# Patient Record
Sex: Female | Born: 1960
Health system: Southern US, Community
[De-identification: ages and names within clinical notes are randomized; demographics above are authoritative.]

## PROBLEM LIST (undated history)

## (undated) DIAGNOSIS — Z9889 Other specified postprocedural states: Secondary | ICD-10-CM

## (undated) DIAGNOSIS — I10 Essential (primary) hypertension: Secondary | ICD-10-CM

## (undated) DIAGNOSIS — K5732 Diverticulitis of large intestine without perforation or abscess without bleeding: Secondary | ICD-10-CM

## (undated) DIAGNOSIS — R112 Nausea with vomiting, unspecified: Secondary | ICD-10-CM

## (undated) DIAGNOSIS — T8859XA Other complications of anesthesia, initial encounter: Secondary | ICD-10-CM

## (undated) DIAGNOSIS — T4145XA Adverse effect of unspecified anesthetic, initial encounter: Secondary | ICD-10-CM

## (undated) HISTORY — PX: CHOLECYSTECTOMY: SHX55

## (undated) HISTORY — DX: Diverticulitis of large intestine without perforation or abscess without bleeding: K57.32

## (undated) HISTORY — PX: WISDOM TOOTH EXTRACTION: SHX21

---

## 2002-09-27 ENCOUNTER — Encounter: Payer: Self-pay | Admitting: Family Medicine

## 2002-09-27 ENCOUNTER — Ambulatory Visit (HOSPITAL_COMMUNITY): Admission: RE | Admit: 2002-09-27 | Discharge: 2002-09-27 | Payer: Self-pay | Admitting: Family Medicine

## 2002-10-09 ENCOUNTER — Encounter (HOSPITAL_COMMUNITY): Admission: RE | Admit: 2002-10-09 | Discharge: 2002-11-08 | Payer: Self-pay | Admitting: Family Medicine

## 2002-10-15 ENCOUNTER — Encounter: Payer: Self-pay | Admitting: Family Medicine

## 2003-01-23 ENCOUNTER — Emergency Department (HOSPITAL_COMMUNITY): Admission: EM | Admit: 2003-01-23 | Discharge: 2003-01-23 | Payer: Self-pay | Admitting: Internal Medicine

## 2003-01-23 ENCOUNTER — Encounter: Payer: Self-pay | Admitting: Internal Medicine

## 2003-01-24 ENCOUNTER — Encounter: Payer: Self-pay | Admitting: Neurosurgery

## 2003-01-24 ENCOUNTER — Ambulatory Visit (HOSPITAL_COMMUNITY): Admission: RE | Admit: 2003-01-24 | Discharge: 2003-01-24 | Payer: Self-pay | Admitting: Neurosurgery

## 2003-04-07 ENCOUNTER — Encounter: Payer: Self-pay | Admitting: Neurosurgery

## 2003-04-07 ENCOUNTER — Ambulatory Visit (HOSPITAL_COMMUNITY): Admission: RE | Admit: 2003-04-07 | Discharge: 2003-04-07 | Payer: Self-pay | Admitting: Neurosurgery

## 2003-11-27 ENCOUNTER — Ambulatory Visit (HOSPITAL_COMMUNITY): Admission: RE | Admit: 2003-11-27 | Discharge: 2003-11-27 | Payer: Self-pay | Admitting: Neurosurgery

## 2005-11-01 ENCOUNTER — Ambulatory Visit (HOSPITAL_COMMUNITY): Admission: RE | Admit: 2005-11-01 | Discharge: 2005-11-01 | Payer: Self-pay | Admitting: Family Medicine

## 2008-01-21 ENCOUNTER — Encounter: Admission: RE | Admit: 2008-01-21 | Discharge: 2008-01-21 | Payer: Self-pay | Admitting: Unknown Physician Specialty

## 2008-01-24 ENCOUNTER — Encounter: Admission: RE | Admit: 2008-01-24 | Discharge: 2008-01-24 | Payer: Self-pay | Admitting: Unknown Physician Specialty

## 2008-08-12 ENCOUNTER — Encounter: Admission: RE | Admit: 2008-08-12 | Discharge: 2008-08-12 | Payer: Self-pay | Admitting: Family Medicine

## 2009-09-11 ENCOUNTER — Encounter: Admission: RE | Admit: 2009-09-11 | Discharge: 2009-09-11 | Payer: Self-pay | Admitting: Obstetrics and Gynecology

## 2009-10-25 ENCOUNTER — Emergency Department (HOSPITAL_COMMUNITY): Admission: EM | Admit: 2009-10-25 | Discharge: 2009-10-25 | Payer: Self-pay | Admitting: Emergency Medicine

## 2010-07-17 ENCOUNTER — Encounter: Payer: Self-pay | Admitting: Neurosurgery

## 2010-07-18 ENCOUNTER — Encounter: Payer: Self-pay | Admitting: Neurosurgery

## 2010-12-03 ENCOUNTER — Other Ambulatory Visit: Payer: Self-pay | Admitting: Unknown Physician Specialty

## 2010-12-03 DIAGNOSIS — Z1231 Encounter for screening mammogram for malignant neoplasm of breast: Secondary | ICD-10-CM

## 2010-12-16 ENCOUNTER — Ambulatory Visit
Admission: RE | Admit: 2010-12-16 | Discharge: 2010-12-16 | Disposition: A | Discharge status: Home / Self Care | Payer: 59 | Source: Ambulatory Visit | Attending: Unknown Physician Specialty | Admitting: Unknown Physician Specialty

## 2010-12-16 DIAGNOSIS — Z1231 Encounter for screening mammogram for malignant neoplasm of breast: Secondary | ICD-10-CM

## 2011-03-15 ENCOUNTER — Other Ambulatory Visit: Payer: Self-pay | Admitting: Family Medicine

## 2011-03-17 ENCOUNTER — Encounter (HOSPITAL_COMMUNITY): Payer: Self-pay

## 2011-03-17 ENCOUNTER — Ambulatory Visit (HOSPITAL_COMMUNITY)
Admission: RE | Admit: 2011-03-17 | Discharge: 2011-03-17 | Disposition: A | Discharge status: Home / Self Care | Payer: 59 | Source: Ambulatory Visit | Attending: Family Medicine | Admitting: Family Medicine

## 2011-03-17 DIAGNOSIS — R319 Hematuria, unspecified: Secondary | ICD-10-CM | POA: Insufficient documentation

## 2011-03-17 DIAGNOSIS — D3000 Benign neoplasm of unspecified kidney: Secondary | ICD-10-CM | POA: Insufficient documentation

## 2011-03-17 DIAGNOSIS — K7689 Other specified diseases of liver: Secondary | ICD-10-CM | POA: Insufficient documentation

## 2011-03-17 DIAGNOSIS — R160 Hepatomegaly, not elsewhere classified: Secondary | ICD-10-CM | POA: Insufficient documentation

## 2011-03-17 HISTORY — DX: Essential (primary) hypertension: I10

## 2011-03-17 MED ORDER — IOHEXOL 300 MG/ML  SOLN
125.0000 mL | Freq: Once | INTRAMUSCULAR | Status: AC | PRN
  Administered 2011-03-17: 125 mL via INTRAVENOUS

## 2011-03-18 ENCOUNTER — Other Ambulatory Visit: Payer: Self-pay | Admitting: Family Medicine

## 2011-03-18 DIAGNOSIS — N289 Disorder of kidney and ureter, unspecified: Secondary | ICD-10-CM

## 2011-03-24 ENCOUNTER — Ambulatory Visit (HOSPITAL_COMMUNITY)
Admission: RE | Admit: 2011-03-24 | Discharge: 2011-03-24 | Disposition: A | Discharge status: Home / Self Care | Payer: 59 | Source: Ambulatory Visit | Attending: Family Medicine | Admitting: Family Medicine

## 2011-03-24 DIAGNOSIS — R319 Hematuria, unspecified: Secondary | ICD-10-CM | POA: Insufficient documentation

## 2011-03-24 DIAGNOSIS — N289 Disorder of kidney and ureter, unspecified: Secondary | ICD-10-CM

## 2011-03-24 MED ORDER — GADOBENATE DIMEGLUMINE 529 MG/ML IV SOLN
16.0000 mL | Freq: Once | INTRAVENOUS | Status: AC | PRN
  Administered 2011-03-24: 16 mL via INTRAVENOUS

## 2011-04-15 ENCOUNTER — Ambulatory Visit (INDEPENDENT_AMBULATORY_CARE_PROVIDER_SITE_OTHER): Payer: 59 | Admitting: Urology

## 2011-04-15 DIAGNOSIS — R310 Gross hematuria: Secondary | ICD-10-CM

## 2011-07-08 ENCOUNTER — Ambulatory Visit (INDEPENDENT_AMBULATORY_CARE_PROVIDER_SITE_OTHER): Payer: BC Managed Care – PPO | Admitting: Urology

## 2011-07-08 DIAGNOSIS — Z87440 Personal history of urinary (tract) infections: Secondary | ICD-10-CM

## 2011-07-08 DIAGNOSIS — R310 Gross hematuria: Secondary | ICD-10-CM

## 2012-09-19 ENCOUNTER — Telehealth: Payer: Self-pay | Admitting: Family Medicine

## 2012-09-19 NOTE — Telephone Encounter (Signed)
May have appointment with me on Friday

## 2012-09-19 NOTE — Telephone Encounter (Signed)
Patient given appointment with Dr. Lorin Picket on Friday.

## 2012-09-19 NOTE — Telephone Encounter (Signed)
Patient is going to make an appt with Eber Jones however wants appt with Dr. Lorin Picket.  Patient wants to know if her leg could be hurting due to spot on her neck (from MRI-several years ago).  She really only wants to see Dr. Lorin Picket.  On the phone for over 30 minutes.  Did make appt for Friday 09/21/2012 at 2:10 with Eber Jones but wants Lorin Picket to call her!

## 2012-09-21 ENCOUNTER — Ambulatory Visit (INDEPENDENT_AMBULATORY_CARE_PROVIDER_SITE_OTHER): Payer: BC Managed Care – PPO | Admitting: Family Medicine

## 2012-09-21 ENCOUNTER — Encounter: Payer: Self-pay | Admitting: Family Medicine

## 2012-09-21 ENCOUNTER — Ambulatory Visit: Payer: Self-pay | Admitting: Nurse Practitioner

## 2012-09-21 VITALS — BP 124/80 | HR 80 | Ht 68.0 in | Wt 191.2 lb

## 2012-09-21 DIAGNOSIS — K3189 Other diseases of stomach and duodenum: Secondary | ICD-10-CM

## 2012-09-21 DIAGNOSIS — M79605 Pain in left leg: Secondary | ICD-10-CM

## 2012-09-21 DIAGNOSIS — M79609 Pain in unspecified limb: Secondary | ICD-10-CM

## 2012-09-21 DIAGNOSIS — R1013 Epigastric pain: Secondary | ICD-10-CM

## 2012-09-21 DIAGNOSIS — G950 Syringomyelia and syringobulbia: Secondary | ICD-10-CM

## 2012-09-21 DIAGNOSIS — I10 Essential (primary) hypertension: Secondary | ICD-10-CM

## 2012-09-21 DIAGNOSIS — M79604 Pain in right leg: Secondary | ICD-10-CM

## 2012-09-21 MED ORDER — TRAMADOL HCL 50 MG PO TABS: 50.0000 mg | ORAL_TABLET | Freq: Four times a day (QID) | ORAL | Status: AC | PRN

## 2012-09-21 NOTE — Progress Notes (Signed)
Front of leg right side Pain level 8/10 Patient relates she's had bilateral leg pain this been going on for the past week and a half it wakes her up at night sometimes on the anterior part of the left leg sometimes on the front of the right leg sometimes in her lower back in addition to this she relates neck discomfort she denies severe headaches. She denies sweats chills nausea vomiting no fevers. No wheezing or difficulty breathing. No other sickness. Tried Vicodin and did not help but made her feel bad.  She has a history of the syrinx of the next neck.  She has seen Dr. Channing Mutters for this. No other complications.  Past medical history hypertension  Vital signs noted neck mild tenderness posterior aspect of the neck lungs clear heart regular pulse normal tendon reflex diminished in the patella as well as the ankle strength is good no numbness noted  A/p   Leg pain, bilateral - Plan: traMADol (ULTRAM) 50 MG tablet  Syrinx of spinal cord  Essential hypertension, benign  Dyspepsia  We will discuss the case with Dr. Channing Mutters to see if he feels that she will need possibly MRI of the neck and a visit first the patient is going to try tramadol see if he gets better over the weekend she will let us know next week and then we will contact Dr. Channing Mutters if it is still bothering her. Followup when necessary warning signs discussed.

## 2012-09-21 NOTE — Patient Instructions (Signed)
Call next week with update

## 2012-09-24 ENCOUNTER — Telehealth: Payer: Self-pay | Admitting: Family Medicine

## 2012-09-24 NOTE — Telephone Encounter (Signed)
Tell the patient that I spoke with Dr. Channing Mutters. He does not feel that her leg symptoms are coming from her neck. He thinks is more likely that it's coming from the lumbar spine. At this point he does not recommend a MRI. He would recommend using a short round of steroids to help with this and she could still use pain medicine as necessary. So I recommend prednisone 20 mg tablets 2 per day for the next 4 days and one a day for the next 4 days. I would also recommend that she followup in approximately 8-10 days. Certainly if symptoms go away may not have to followup she could call. If worsening symptoms may need to be seen sooner.

## 2012-09-24 NOTE — Telephone Encounter (Signed)
Let me speak with Dr.Roy neuorosurgeon in [place]

## 2012-09-24 NOTE — Telephone Encounter (Signed)
Pt states she is not improving for her leg pain, to go ahead with the next step please

## 2012-09-25 ENCOUNTER — Telehealth: Payer: Self-pay | Admitting: *Deleted

## 2012-09-25 NOTE — Telephone Encounter (Signed)
Left message to return call 

## 2012-09-28 ENCOUNTER — Encounter: Payer: Self-pay | Admitting: *Deleted

## 2012-10-02 ENCOUNTER — Ambulatory Visit (INDEPENDENT_AMBULATORY_CARE_PROVIDER_SITE_OTHER): Payer: BC Managed Care – PPO | Admitting: Family Medicine

## 2012-10-02 ENCOUNTER — Encounter: Payer: Self-pay | Admitting: Family Medicine

## 2012-10-02 VITALS — BP 122/74 | HR 90 | Ht 67.0 in | Wt 195.0 lb

## 2012-10-02 DIAGNOSIS — M545 Low back pain, unspecified: Secondary | ICD-10-CM

## 2012-10-02 NOTE — Progress Notes (Deleted)
  Subjective:    Patient ID: Anita Shea, female    DOB: [birth year], 52 y.o.   MRN: [id number]  Leg Pain  The incident occurred more than 1 week ago. The incident occurred at home. The injury mechanism is unknown. The pain is present in the right leg. The quality of the pain is described as aching. The pain is at a severity of 8/10. The pain is moderate. The pain has been fluctuating since onset. Associated symptoms include numbness. She reports no foreign bodies present. The symptoms are aggravated by movement. She has tried elevation and acetaminophen for the symptoms. The treatment provided no relief.       Review of Systems  Neurological: Positive for numbness.      [phone] Objective:   Physical Exam        Assessment & Plan:

## 2012-10-02 NOTE — Patient Instructions (Addendum)
Go do xray lumbar xray I'll call with results

## 2012-10-02 NOTE — Progress Notes (Signed)
  Subjective:    Patient ID: Anita Shea, female    DOB: [birth year], 52 y.o.   MRN: [id number]  HPIthis patient with unusual symptoms. She is having pain in both legs. It started approximately 15 days ago. She describes severe pain that radiates from her hips down the front of her legs on the right leg it radiates all the way down to the foot on the left leg it radiates down to the knee she states the burning aching pain occasionally tingling it does keep her awake at night is hard to find any comfortable position she cannot take anti-inflammatories due to an allergy she relates that if she uses all TRAM it does seem to help. She states that she does not want to keep taking medicine she wants a problem go away. She knows of no injury but she did do some bicycle riding the weekend before this started occurring. Has never had this problem before.  She did try a trial of prednisone and that did not seem to help much.  Review of Systemssee above no constipation rectal bleeding fever chills or abdominal pain.she does relate a little bit of lower back pain.     Objective:   Physical Exam Abdomen is soft lower back nontender she does have some tenderness in the groin region left and right side. Negative straight leg raise reflexes good strength good       Assessment & Plan:  Leg pain-hard to tell if this is from lumbar spine or if this is a tendinitis where the muscles attach at the groin region. She may continue the Ultram we will do lumbar spine x-rays we will be setting her up with physical medicine specialist.

## 2012-10-03 ENCOUNTER — Ambulatory Visit (HOSPITAL_COMMUNITY)
Admission: RE | Admit: 2012-10-03 | Discharge: 2012-10-03 | Disposition: A | Discharge status: Home / Self Care | Payer: BC Managed Care – PPO | Source: Ambulatory Visit | Attending: Family Medicine | Admitting: Family Medicine

## 2012-10-03 DIAGNOSIS — M545 Low back pain, unspecified: Secondary | ICD-10-CM

## 2012-10-03 DIAGNOSIS — R209 Unspecified disturbances of skin sensation: Secondary | ICD-10-CM | POA: Insufficient documentation

## 2012-10-05 ENCOUNTER — Other Ambulatory Visit: Payer: Self-pay | Admitting: Family Medicine

## 2012-10-05 DIAGNOSIS — M25569 Pain in unspecified knee: Secondary | ICD-10-CM

## 2012-12-22 ENCOUNTER — Other Ambulatory Visit: Payer: Self-pay | Admitting: Family Medicine

## 2012-12-24 ENCOUNTER — Other Ambulatory Visit: Payer: Self-pay

## 2012-12-24 DIAGNOSIS — Z1231 Encounter for screening mammogram for malignant neoplasm of breast: Secondary | ICD-10-CM

## 2013-01-21 ENCOUNTER — Ambulatory Visit: Payer: BC Managed Care – PPO

## 2013-02-05 ENCOUNTER — Ambulatory Visit
Admission: RE | Admit: 2013-02-05 | Discharge: 2013-02-05 | Disposition: A | Discharge status: Home / Self Care | Payer: BC Managed Care – PPO | Source: Ambulatory Visit

## 2013-02-05 DIAGNOSIS — Z1231 Encounter for screening mammogram for malignant neoplasm of breast: Secondary | ICD-10-CM

## 2013-03-04 ENCOUNTER — Ambulatory Visit (INDEPENDENT_AMBULATORY_CARE_PROVIDER_SITE_OTHER): Payer: BC Managed Care – PPO | Admitting: Family Medicine

## 2013-03-04 ENCOUNTER — Encounter: Payer: Self-pay | Admitting: Family Medicine

## 2013-03-04 ENCOUNTER — Other Ambulatory Visit: Payer: Self-pay | Admitting: Family Medicine

## 2013-03-04 VITALS — BP 136/90 | Temp 98.7°F | Ht 67.5 in | Wt 203.8 lb

## 2013-03-04 DIAGNOSIS — J329 Chronic sinusitis, unspecified: Secondary | ICD-10-CM

## 2013-03-04 MED ORDER — CEFDINIR 300 MG PO CAPS: 300.0000 mg | ORAL_CAPSULE | Freq: Two times a day (BID) | ORAL | Status: DC

## 2013-03-04 NOTE — Progress Notes (Signed)
  Subjective:    Patient ID: Anita Shea, female    DOB: 01/09/1961, 52 y.o.   MRN: [id number]  Sinusitis This is a new problem. The current episode started in the past 7 days. The problem is unchanged. There has been no fever. Associated symptoms include congestion, headaches, a hoarse voice, neck pain, sinus pressure, sneezing, a sore throat and swollen glands. Past treatments include oral decongestants. The treatment provided no relief.   Friday, socked in cong estion and cough,   Took two tyl sinus, didn't help much, throat scratchy,   Review of Systems  HENT: Positive for congestion, sore throat, hoarse voice, sneezing, neck pain and sinus pressure.   Neurological: Positive for headaches.   No naus no loose stools     Objective:   Physical Exam No home illness Alert mild malaise. HEENT moderate nasal congestion. Frontal tenderness. Pharynx erythematous neck supple. Lungs clear. Heart regular in rhythm.      Assessment & Plan:  Impression #1 acute rhinosinusitis. Plan antibiotics prescribed. Symptomatic care discussed. WSL

## 2013-03-25 ENCOUNTER — Other Ambulatory Visit: Payer: Self-pay | Admitting: Family Medicine

## 2013-06-18 ENCOUNTER — Other Ambulatory Visit: Payer: Self-pay | Admitting: Family Medicine

## 2013-06-29 ENCOUNTER — Other Ambulatory Visit: Payer: Self-pay | Admitting: Family Medicine

## 2013-07-17 ENCOUNTER — Other Ambulatory Visit: Payer: Self-pay | Admitting: Family Medicine

## 2013-07-29 ENCOUNTER — Other Ambulatory Visit: Payer: Self-pay | Admitting: Family Medicine

## 2013-10-30 ENCOUNTER — Other Ambulatory Visit: Payer: Self-pay | Admitting: Family Medicine

## 2013-10-31 ENCOUNTER — Ambulatory Visit (INDEPENDENT_AMBULATORY_CARE_PROVIDER_SITE_OTHER): Payer: BC Managed Care – PPO | Admitting: Family Medicine

## 2013-10-31 ENCOUNTER — Encounter: Payer: Self-pay | Admitting: Family Medicine

## 2013-10-31 VITALS — BP 132/82 | Ht 68.0 in | Wt 179.0 lb

## 2013-10-31 DIAGNOSIS — E781 Pure hyperglyceridemia: Secondary | ICD-10-CM

## 2013-10-31 DIAGNOSIS — E785 Hyperlipidemia, unspecified: Secondary | ICD-10-CM | POA: Insufficient documentation

## 2013-10-31 NOTE — Progress Notes (Signed)
   Subjective:    Patient ID: Anita Shea, female    DOB: 10-14-1960, 53 y.o.   MRN: [id number]  Hypertension This is a chronic problem. The current episode started more than 1 year ago. Treatments tried: hctz, potassium. There are no compliance problems.   Exercising 6 days a week and following a low fat diet.  Had bloodwork done through employer. Will drop off bloodwork results.   She does have family history diabetes   Review of Systems Denies chest tightness pressure pain shortness breath nausea vomiting diarrhea    Objective:   Physical Exam Lungs clear hearts regular pulse normal blood pressure checked twice was very good extremities no edema skin warm dry   She found her blood work and we are able to review it    Assessment & Plan:  #1 elevated triglycerides continue healthy diet continue exercise read look at this again in one years time  #2 sugar looks good  #3 blood pressure under good control continue current medications  #4 patient is trying to lose weight continue exercise watching

## 2014-01-25 ENCOUNTER — Other Ambulatory Visit: Payer: Self-pay | Admitting: Family Medicine

## 2014-04-08 ENCOUNTER — Encounter: Payer: Self-pay | Admitting: Family Medicine

## 2014-04-08 ENCOUNTER — Ambulatory Visit (INDEPENDENT_AMBULATORY_CARE_PROVIDER_SITE_OTHER): Payer: BC Managed Care – PPO | Admitting: Family Medicine

## 2014-04-08 VITALS — BP 138/88 | Temp 98.4°F | Ht 68.0 in | Wt 176.0 lb

## 2014-04-08 DIAGNOSIS — J0100 Acute maxillary sinusitis, unspecified: Secondary | ICD-10-CM

## 2014-04-08 MED ORDER — LEVOFLOXACIN 500 MG PO TABS: 500.0000 mg | ORAL_TABLET | Freq: Every day | ORAL | Status: DC

## 2014-04-08 NOTE — Progress Notes (Signed)
   Subjective:    Patient ID: Anita Shea, female    DOB: 06-15-1961, 53 y.o.   MRN: [id number]  Sinus Problem This is a new problem. The current episode started in the past 7 days. There has been no fever (Temp: 98.4). Associated symptoms include congestion, coughing, headaches, sinus pressure and a sore throat. Pertinent negatives include no ear pain or shortness of breath. Past treatments include acetaminophen. The treatment provided no relief.    PMH benign  Review of Systems  Constitutional: Negative for fever and activity change.  HENT: Positive for congestion, rhinorrhea, sinus pressure and sore throat. Negative for ear pain.   Eyes: Negative for discharge.  Respiratory: Positive for cough. Negative for shortness of breath and wheezing.   Cardiovascular: Negative for chest pain.  Neurological: Positive for headaches.       Objective:   Physical Exam  Nursing note and vitals reviewed. Constitutional: She appears well-developed.  HENT:  Head: Normocephalic.  Nose: Nose normal.  Mouth/Throat: Oropharynx is clear and moist. No oropharyngeal exudate.  Neck: Neck supple.  Cardiovascular: Normal rate and normal heart sounds.   No murmur heard. Pulmonary/Chest: Effort normal and breath sounds normal. She has no wheezes.  Lymphadenopathy:    She has no cervical adenopathy.  Skin: Skin is warm and dry.          Assessment & Plan:  Patient with viral syndrome with secondary sinusitis antibiotics prescribed warning signs discussed followup if ongoing trouble  Blood pressure mildly elevated she needs to avoid decongestants

## 2014-04-08 NOTE — Patient Instructions (Signed)
DASH Eating Plan °DASH stands for "Dietary Approaches to Stop Hypertension." The DASH eating plan is a healthy eating plan that has been shown to reduce high blood pressure (hypertension). Additional health benefits may include reducing the risk of type 2 diabetes mellitus, heart disease, and stroke. The DASH eating plan may also help with weight loss. °WHAT DO I NEED TO KNOW ABOUT THE DASH EATING PLAN? °For the DASH eating plan, you will follow these general guidelines: °· Choose foods with a percent daily value for sodium of less than 5% (as listed on the food label). °· Use salt-free seasonings or herbs instead of table salt or sea salt. °· Check with your health care provider or pharmacist before using salt substitutes. °· Eat lower-sodium products, often labeled as "lower sodium" or "no salt added." °· Eat fresh foods. °· Eat more vegetables, fruits, and low-fat dairy products. °· Choose whole grains. Look for the word "whole" as the first word in the ingredient list. °· Choose fish and skinless chicken or turkey more often than red meat. Limit fish, poultry, and meat to 6 oz (170 g) each day. °· Limit sweets, desserts, sugars, and sugary drinks. °· Choose heart-healthy fats. °· Limit cheese to 1 oz (28 g) per day. °· Eat more home-cooked food and less restaurant, buffet, and fast food. °· Limit fried foods. °· Cook foods using methods other than frying. °· Limit canned vegetables. If you do use them, rinse them well to decrease the sodium. °· When eating at a restaurant, ask that your food be prepared with less salt, or no salt if possible. °WHAT FOODS CAN I EAT? °Seek help from a dietitian for individual calorie needs. °Grains °Whole grain or whole wheat bread. Brown rice. Whole grain or whole wheat pasta. Quinoa, bulgur, and whole grain cereals. Low-sodium cereals. Corn or whole wheat flour tortillas. Whole grain cornbread. Whole grain crackers. Low-sodium crackers. °Vegetables °Fresh or frozen vegetables  (raw, steamed, roasted, or grilled). Low-sodium or reduced-sodium tomato and vegetable juices. Low-sodium or reduced-sodium tomato sauce and paste. Low-sodium or reduced-sodium canned vegetables.  °Fruits °All fresh, canned (in natural juice), or frozen fruits. °Meat and Other Protein Products °Ground beef (85% or leaner), grass-fed beef, or beef trimmed of fat. Skinless chicken or turkey. Ground chicken or turkey. Pork trimmed of fat. All fish and seafood. Eggs. Dried beans, peas, or lentils. Unsalted nuts and seeds. Unsalted canned beans. °Dairy °Low-fat dairy products, such as skim or 1% milk, 2% or reduced-fat cheeses, low-fat ricotta or cottage cheese, or plain low-fat yogurt. Low-sodium or reduced-sodium cheeses. °Fats and Oils °Tub margarines without trans fats. Light or reduced-fat mayonnaise and salad dressings (reduced sodium). Avocado. Safflower, olive, or canola oils. Natural peanut or almond butter. °Other °Unsalted popcorn and pretzels. °The items listed above may not be a complete list of recommended foods or beverages. Contact your dietitian for more options. °WHAT FOODS ARE NOT RECOMMENDED? °Grains °White bread. White pasta. White rice. Refined cornbread. Bagels and croissants. Crackers that contain trans fat. °Vegetables °Creamed or fried vegetables. Vegetables in a cheese sauce. Regular canned vegetables. Regular canned tomato sauce and paste. Regular tomato and vegetable juices. °Fruits °Dried fruits. Canned fruit in light or heavy syrup. Fruit juice. °Meat and Other Protein Products °Fatty cuts of meat. Ribs, chicken wings, bacon, sausage, bologna, salami, chitterlings, fatback, hot dogs, bratwurst, and packaged luncheon meats. Salted nuts and seeds. Canned beans with salt. °Dairy °Whole or 2% milk, cream, half-and-half, and cream cheese. Whole-fat or sweetened yogurt. Full-fat   cheeses or blue cheese. Nondairy creamers and whipped toppings. Processed cheese, cheese spreads, or cheese  curds. °Condiments °Onion and garlic salt, seasoned salt, table salt, and sea salt. Canned and packaged gravies. Worcestershire sauce. Tartar sauce. Barbecue sauce. Teriyaki sauce. Soy sauce, including reduced sodium. Steak sauce. Fish sauce. Oyster sauce. Cocktail sauce. Horseradish. Ketchup and mustard. Meat flavorings and tenderizers. Bouillon cubes. Hot sauce. Tabasco sauce. Marinades. Taco seasonings. Relishes. °Fats and Oils °Butter, stick margarine, lard, shortening, ghee, and bacon fat. Coconut, palm kernel, or palm oils. Regular salad dressings. °Other °Pickles and olives. Salted popcorn and pretzels. °The items listed above may not be a complete list of foods and beverages to avoid. Contact your dietitian for more information. °WHERE CAN I FIND MORE INFORMATION? °National Heart, Lung, and [employer] Institute: www.nhlbi.nih.gov/health/health-topics/topics/dash/ °Document Released: 06/02/2011 Document Revised: 10/28/2013 Document Reviewed: 04/17/2013 °ExitCare® Patient Information ©2015 [employer]. This information is not intended to replace advice given to you by your health care provider. Make sure you discuss any questions you have with your health care provider. ° °

## 2014-04-23 ENCOUNTER — Other Ambulatory Visit: Payer: Self-pay | Admitting: Family Medicine

## 2014-05-21 ENCOUNTER — Other Ambulatory Visit: Payer: Self-pay | Admitting: Family Medicine

## 2014-06-12 ENCOUNTER — Other Ambulatory Visit (HOSPITAL_COMMUNITY): Payer: Self-pay | Admitting: Orthopedic Surgery

## 2014-06-12 DIAGNOSIS — M8580 Other specified disorders of bone density and structure, unspecified site: Secondary | ICD-10-CM

## 2014-06-16 ENCOUNTER — Other Ambulatory Visit: Payer: Self-pay | Admitting: Family Medicine

## 2014-06-23 ENCOUNTER — Ambulatory Visit (HOSPITAL_COMMUNITY): Payer: BC Managed Care – PPO

## 2014-07-10 ENCOUNTER — Encounter: Payer: Self-pay | Admitting: Family Medicine

## 2014-07-10 ENCOUNTER — Ambulatory Visit (INDEPENDENT_AMBULATORY_CARE_PROVIDER_SITE_OTHER): Payer: BC Managed Care – PPO | Admitting: Family Medicine

## 2014-07-10 VITALS — BP 136/86 | Ht 67.0 in | Wt 174.0 lb

## 2014-07-10 DIAGNOSIS — R000 Tachycardia, unspecified: Secondary | ICD-10-CM

## 2014-07-10 NOTE — Progress Notes (Signed)
   Subjective:    Patient ID: Anita Shea, female    DOB: 1960-07-08, 54 y.o.   MRN: [id number]  HPI Patient is here today for tachycardia.  She got a FitBit for Christmas. It has been showing that her HR is running high, in the upper 100's, and she has not been doing anything.  She does work out for about 30 mins a day.  The FitBit is showing her HR is in the 100's when she is just making coffee.  Denies chest pain/discomfort/SOB.   We had a long discussion greater than 15 minutes about her heart rate being slightly elevated she does not have any symptoms with it no chest tightness pressure pain or palpitations or even feels her heart rate being fast. I showed her how to properly check her blood pressure in order to make sure that that's not going up plus also showed her how to check her heart rate properly  Review of Systems     Objective:   Physical Exam  Her resting heart rate is normal lungs clear pulse normal blood pressure is acceptable      Assessment & Plan:  Borderline blood pressure watch diet closely exercise regularly keep salt low try to lose some weight. Of blood pressure starts creeping up to let us know. Follow-up when necessary  Intermittent tachycardia-I encouraged patient not to check her fit bit quite as often because I believe that that can provoke slight anxiety about heart rate which in turn can cause heart regular regular physical activity and exercise would be the best if she starts having heart rates spells and 1 dirty's or higher without any triggers she is to let us know we will set her up for telemetry

## 2014-08-01 ENCOUNTER — Encounter: Payer: Self-pay | Admitting: Family Medicine

## 2014-08-19 ENCOUNTER — Other Ambulatory Visit: Payer: Self-pay | Admitting: Family Medicine

## 2014-09-18 ENCOUNTER — Other Ambulatory Visit: Payer: Self-pay | Admitting: Family Medicine

## 2014-10-17 ENCOUNTER — Other Ambulatory Visit: Payer: Self-pay | Admitting: Family Medicine

## 2014-11-10 ENCOUNTER — Ambulatory Visit (INDEPENDENT_AMBULATORY_CARE_PROVIDER_SITE_OTHER): Payer: BC Managed Care – PPO | Admitting: Family Medicine

## 2014-11-10 ENCOUNTER — Encounter: Payer: Self-pay | Admitting: Family Medicine

## 2014-11-10 VITALS — BP 120/78 | Temp 98.6°F | Ht 67.0 in | Wt 174.8 lb

## 2014-11-10 DIAGNOSIS — I889 Nonspecific lymphadenitis, unspecified: Secondary | ICD-10-CM

## 2014-11-10 MED ORDER — AZITHROMYCIN 250 MG PO TABS: ORAL_TABLET | ORAL | Status: DC

## 2014-11-10 NOTE — Progress Notes (Signed)
   Subjective:    Patient ID: Anita Shea, female    DOB: 1960/12/08, 54 y.o.   MRN: [id number]  Sore Throat  This is a new problem. The current episode started in the past 7 days. Associated symptoms include congestion and headaches. She has tried acetaminophen for the symptoms.   Patient also having Neck pain  Feels pain in the ne k  Went on to school  Throat inflammed   Trouble with swallowing Pain  uncdrain exposure to throat infxns  Some clearing of throat   Dim appetite   Tylenol for the sore throat     Review of Systems  HENT: Positive for congestion.   Neurological: Positive for headaches.    no vomiting or diarrhea    Objective:   Physical Exam    alert hydration good vitals stable HEENT very erythematous throat tender anterior nodes neck supple. Lungs clear heart rate and rhythm.     Assessment & Plan:   impression pharyngitis with cervical lymphadenitis plan Z-Pak. Symptomatic care discussed. Tylenol when necessary. WSL

## 2014-11-15 ENCOUNTER — Other Ambulatory Visit: Payer: Self-pay | Admitting: Family Medicine

## 2015-01-09 ENCOUNTER — Ambulatory Visit (INDEPENDENT_AMBULATORY_CARE_PROVIDER_SITE_OTHER): Payer: BC Managed Care – PPO | Admitting: Family Medicine

## 2015-01-09 ENCOUNTER — Encounter: Payer: Self-pay | Admitting: Family Medicine

## 2015-01-09 VITALS — BP 128/82 | Ht 67.0 in | Wt 179.0 lb

## 2015-01-09 DIAGNOSIS — J019 Acute sinusitis, unspecified: Secondary | ICD-10-CM

## 2015-01-09 DIAGNOSIS — R519 Headache, unspecified: Secondary | ICD-10-CM

## 2015-01-09 DIAGNOSIS — R51 Headache: Secondary | ICD-10-CM

## 2015-01-09 DIAGNOSIS — B9689 Other specified bacterial agents as the cause of diseases classified elsewhere: Secondary | ICD-10-CM

## 2015-01-09 MED ORDER — PROMETHAZINE HCL 25 MG PO TABS: 25.0000 mg | ORAL_TABLET | Freq: Four times a day (QID) | ORAL | Status: DC | PRN

## 2015-01-09 MED ORDER — AMOXICILLIN 500 MG PO TABS: 500.0000 mg | ORAL_TABLET | Freq: Three times a day (TID) | ORAL | Status: DC

## 2015-01-09 MED ORDER — HYDROCODONE-ACETAMINOPHEN 5-325 MG PO TABS
1.0000 | ORAL_TABLET | Freq: Four times a day (QID) | ORAL | Status: DC | PRN
Start: 2015-01-09 — End: 2015-03-06

## 2015-01-09 NOTE — Progress Notes (Signed)
   Subjective:    Patient ID: Anita Shea, female    DOB: 07-21-1960, 54 y.o.   MRN: [id number]  HPI  Patient arrives with c/o right jaw pain for one week. Patient went to the dentist and was told the pain is not dental in nature. Patient would like rx printed if one is issued today. She saw her Simona Huh she was told it was not dental pain she does relate a little better head congestion and a little bit of sinus pressure today with pain she denies any other particular troubles no fever no malaise pain kept her awake but Tylenol when help Review of Systems     Objective:   Physical Exam Eardrums normal throat is normal excessive receding diet gums are noted. No obvious gingivitis. Some slight sinus tenderness in the right maxillary sinus neck is supple no adenopathy lungs clear no rash noted       Assessment & Plan:  Maximum dose of Tylenol 3000 mg May take hydrocodone when necessary for pain caution drowsiness Nausea medicines and discussing case she needs it caution drowsiness Antibiotics for probable sinus possible dental infection if not better by Monday or Tuesday notify us Possibility of underlying shingles could be an issue we will see how the next few days play out

## 2015-01-15 ENCOUNTER — Telehealth: Payer: Self-pay | Admitting: Family Medicine

## 2015-01-15 NOTE — Telephone Encounter (Signed)
Patient wanted you to know got tooth pulled by  Dr. Keane Police in [place] on Tuesday. It was pressing on a nerve. She wants to say thanks for antibotic it really helped.States she seem to be improving. Thanks for all your help.

## 2015-01-16 ENCOUNTER — Other Ambulatory Visit: Payer: Self-pay | Admitting: Family Medicine

## 2015-01-16 NOTE — Telephone Encounter (Signed)
Needs office visit.

## 2015-01-19 NOTE — Telephone Encounter (Signed)
So noted 

## 2015-02-11 ENCOUNTER — Other Ambulatory Visit: Payer: Self-pay | Admitting: Family Medicine

## 2015-03-06 ENCOUNTER — Encounter: Payer: Self-pay | Admitting: Family Medicine

## 2015-03-06 ENCOUNTER — Ambulatory Visit (INDEPENDENT_AMBULATORY_CARE_PROVIDER_SITE_OTHER): Payer: BC Managed Care – PPO | Admitting: Family Medicine

## 2015-03-06 VITALS — BP 128/82 | Ht 67.0 in | Wt 182.0 lb

## 2015-03-06 DIAGNOSIS — I10 Essential (primary) hypertension: Secondary | ICD-10-CM | POA: Diagnosis not present

## 2015-03-06 MED ORDER — HYDROCHLOROTHIAZIDE 25 MG PO TABS: ORAL_TABLET | ORAL | Status: DC

## 2015-03-06 MED ORDER — POTASSIUM CHLORIDE ER 10 MEQ PO TBCR: EXTENDED_RELEASE_TABLET | ORAL | Status: DC

## 2015-03-06 NOTE — Patient Instructions (Signed)
Dear Patient,  It has been recommended to you that you have a colonoscopy. It is your responsibility to carry through with this recommendation.   Did you realize that colon cancer is the second leading cancer killer in the [place]. One in every 20 adults will get colon cancer. If all adults would go through the recommended screening for colon cancer (getting a colonoscopy), then there would be a 60% reduction in the number of people dying from colon cancer.  Colon cancer just doesn't come out of the blue. It starts off as a small polyp which over time grows into a cancer. A colonoscopy can prevent cancer and in many cases detected when it is at a very treatable phase. Small colon cancers can have cure rates of 95%. Advanced colon cancer, which often occurs in people who do not do their screenings, have cure rates less than 20%. The risk of colon cancer advances with age. Most adults should have regular colonoscopies every 10 years starting at age 53. This recommendation can vary depending on a person's medical history.  Health-care laws now allow for you to call the gastroenterologist office directly in order to set yourself up for this very important tests. Today we have recommended to you that you do this test. This test may save your life. Failure to do this test puts you at risk for premature death from colon cancer. Do the right thing and schedule this test now.  Here as a list of specialists we recommend in the surrounding area. When you call their office let them know that you are a patient of our practice in your interested in doing a screening colonoscopy. They should assist you without problems. You will need the following information when you called them: 1-name of which Dr. you see, 2-your insurance information, 3-a list of medications that you currently take, 4-any allergies you have to medications.  [facility] Dr. Mike Rourk, Dr Sandi Fields   [place]  gastroenterologist   [phone]  Dr.Najeeb Rehman [facility]   [phone]   gastroenterology [facility] (Dr. Perry, N, Stark, Brodie, Gesner, Jacobs and Pyrtle) [phone]  [facility] (Dr. Buscemi, Edwards, Hayes, [place],Outlaw,Schooler) [phone]  Each group of specialists has assured us that when you called them they will help you get your colonoscopy set up. Should you have problems or if the GI practice insist a referral be done please let us know. Be sure to call soon. Sincerely, Carolyn Hoskins, Dr Steve Drewey Begue, Dr.Shreya Lacasse    

## 2015-03-06 NOTE — Progress Notes (Signed)
   Subjective:    Patient ID: Anita Shea, female    DOB: [birth year], 54 y.o.   MRN: [id number]  Hypertension This is a chronic problem. The current episode started more than 1 year ago. Treatments tried: HCTZ. There are no compliance problems.    Patient relates she is been trying to watch her diet she's been gaining some weight patient relates overall energy doing well. She does take her medicine on a regular basis. Tries to watch her diet. PMH benign   Review of Systems No chest tightness pressure pain shortness breath swelling in the legs. No abdominal pain or rectal bleeding    Objective:   Physical Exam Lungs clear heart regular pulse normal BP good extremities no edema skin warm dry neurologic gross normal       Assessment & Plan:  HTN good control continue current measures follow-up in one year. Follow-up sooner problems. I reviewed over lab work that she brought from her job with this system lab work looks good. She is to get this again in the spring and she will bring to [place] again  I encouraged her to get a colonoscopy and stay current on mammograms

## 2015-10-20 ENCOUNTER — Other Ambulatory Visit: Payer: Self-pay

## 2015-10-20 DIAGNOSIS — Z1231 Encounter for screening mammogram for malignant neoplasm of breast: Secondary | ICD-10-CM

## 2015-11-10 ENCOUNTER — Ambulatory Visit
Admission: RE | Admit: 2015-11-10 | Discharge: 2015-11-10 | Disposition: A | Discharge status: Home / Self Care | Payer: BC Managed Care – PPO | Source: Ambulatory Visit

## 2015-11-10 DIAGNOSIS — Z1231 Encounter for screening mammogram for malignant neoplasm of breast: Secondary | ICD-10-CM

## 2015-11-18 ENCOUNTER — Other Ambulatory Visit: Payer: Self-pay | Admitting: Family Medicine

## 2015-11-24 ENCOUNTER — Encounter: Payer: Self-pay | Admitting: Family Medicine

## 2015-11-24 ENCOUNTER — Ambulatory Visit (INDEPENDENT_AMBULATORY_CARE_PROVIDER_SITE_OTHER): Payer: BC Managed Care – PPO | Admitting: Family Medicine

## 2015-11-24 VITALS — BP 120/80 | Temp 98.3°F | Ht 67.0 in | Wt 189.0 lb

## 2015-11-24 DIAGNOSIS — J019 Acute sinusitis, unspecified: Secondary | ICD-10-CM | POA: Diagnosis not present

## 2015-11-24 DIAGNOSIS — I10 Essential (primary) hypertension: Secondary | ICD-10-CM

## 2015-11-24 DIAGNOSIS — B9689 Other specified bacterial agents as the cause of diseases classified elsewhere: Secondary | ICD-10-CM

## 2015-11-24 DIAGNOSIS — E785 Hyperlipidemia, unspecified: Secondary | ICD-10-CM | POA: Diagnosis not present

## 2015-11-24 MED ORDER — HYDROCODONE-HOMATROPINE 5-1.5 MG/5ML PO SYRP: 5.0000 mL | ORAL_SOLUTION | Freq: Four times a day (QID) | ORAL | Status: DC | PRN

## 2015-11-24 MED ORDER — AMOXICILLIN 500 MG PO TABS: 500.0000 mg | ORAL_TABLET | Freq: Three times a day (TID) | ORAL | Status: DC

## 2015-11-24 MED ORDER — HYDROCHLOROTHIAZIDE 25 MG PO TABS: 25.0000 mg | ORAL_TABLET | Freq: Every day | ORAL | Status: DC

## 2015-11-24 NOTE — Progress Notes (Signed)
   Subjective:    Patient ID: Anita Shea, female    DOB: 13-Jul-1960, 55 y.o.   MRN: [id number]  Sinusitis This is a new problem. The current episode started in the past 7 days. The problem is unchanged. The pain is moderate. Associated symptoms include chills, congestion, coughing, headaches and a sore throat. Pertinent negatives include no ear pain or shortness of breath. (Runny nose, fever) Past treatments include oral decongestants. The treatment provided no relief.   Patient states that she has no other concerns at this time.    Review of Systems  Constitutional: Positive for chills. Negative for fever and activity change.  HENT: Positive for congestion, rhinorrhea and sore throat. Negative for ear pain.   Eyes: Negative for discharge.  Respiratory: Positive for cough. Negative for shortness of breath and wheezing.   Cardiovascular: Negative for chest pain.  Neurological: Positive for headaches.       Objective:   Physical Exam  Constitutional: She appears well-developed.  HENT:  Head: Normocephalic.  Nose: Nose normal.  Mouth/Throat: Oropharynx is clear and moist. No oropharyngeal exudate.  Neck: Neck supple.  Cardiovascular: Normal rate and normal heart sounds.   No murmur heard. Pulmonary/Chest: Effort normal and breath sounds normal. She has no wheezes.  Lymphadenopathy:    She has no cervical adenopathy.  Skin: Skin is warm and dry.  Nursing note and vitals reviewed.    I believe the patient a viral like illness that resulted in a secondary sinus infection     Assessment & Plan:  Refills on her blood pressure medicine given she needs a follow-up for this by September lab work recommended  Sinusitis antibiotics prescribed warning signs discussed follow-up if progressive troubles

## 2015-12-02 DIAGNOSIS — Z1151 Encounter for screening for human papillomavirus (HPV): Secondary | ICD-10-CM | POA: Diagnosis not present

## 2015-12-02 DIAGNOSIS — Z01419 Encounter for gynecological examination (general) (routine) without abnormal findings: Secondary | ICD-10-CM | POA: Diagnosis not present

## 2016-03-05 ENCOUNTER — Emergency Department (HOSPITAL_COMMUNITY)
Admission: EM | Admit: 2016-03-05 | Discharge: 2016-03-05 | Disposition: A | Discharge status: Home / Self Care | Payer: 59 | Attending: Emergency Medicine | Admitting: Emergency Medicine

## 2016-03-05 ENCOUNTER — Encounter (HOSPITAL_COMMUNITY): Payer: Self-pay

## 2016-03-05 DIAGNOSIS — Z79899 Other long term (current) drug therapy: Secondary | ICD-10-CM | POA: Diagnosis not present

## 2016-03-05 DIAGNOSIS — H4312 Vitreous hemorrhage, left eye: Secondary | ICD-10-CM | POA: Diagnosis not present

## 2016-03-05 DIAGNOSIS — I10 Essential (primary) hypertension: Secondary | ICD-10-CM | POA: Diagnosis not present

## 2016-03-05 DIAGNOSIS — H43812 Vitreous degeneration, left eye: Secondary | ICD-10-CM

## 2016-03-05 DIAGNOSIS — H539 Unspecified visual disturbance: Secondary | ICD-10-CM | POA: Diagnosis present

## 2016-03-05 NOTE — ED Provider Notes (Signed)
[facility] DEPT Provider Note   CSN: [id number] Arrival date & time: 03/05/16  1433     History   Chief Complaint Chief Complaint  Patient presents with  . visual changes    HPI Anita Shea is a 55 y.o. female.  HPI 54 year old female who presents with visual changes from the left eye. Normally wears contacts and has a history of hypertension and hyper triglyceridemia. States that she has been in her usual state of health and around 11 to noon while cleaning the kitchen and noticed a black spot that floated across her eye. This was subsequently followed by flashes of light in the left eye. Called her optometrist at around 1 PM and was directed to the ED for evaluation. States that since then the flashes of lights have gotten better. Denies any eye pain, eye redness, visual field cut or vision loss.  Past Medical History:  Diagnosis Date  . Hypertension     Patient Active Problem List   Diagnosis Date Noted  . Hypertriglyceridemia 10/31/2013  . Syrinx of spinal cord ([place]) 09/21/2012  . Essential hypertension, benign 09/21/2012  . Dyspepsia 09/21/2012    Past Surgical History:  Procedure Laterality Date  . CHOLECYSTECTOMY      OB History    No data available       Home Medications    Prior to Admission medications   Medication Sig Start Date End Date Taking? Authorizing Provider  Calcium Carb-Cholecalciferol (CALCIUM + D3 PO) Take 1 tablet by mouth daily.    Yes Historical Provider, MD  cetirizine (ZYRTEC) 10 MG tablet Take 10 mg by mouth daily.   Yes Historical Provider, MD  hydrochlorothiazide (HYDRODIURIL) 25 MG tablet Take 1 tablet (25 mg total) by mouth daily. 11/24/15  Yes Anita Drown, MD  potassium chloride (K-DUR) 10 MEQ tablet TAKE 2 TABLETS BY MOUTH ONCE DAILY. 03/06/15  Yes Anita Drown, MD  amoxicillin (AMOXIL) 500 MG tablet Take 1 tablet (500 mg total) by mouth 3 (three) times daily. Patient not taking: Reported on 03/05/2016 11/24/15   Anita Drown, MD  HYDROcodone-homatropine [facility]) 5-1.5 MG/5ML syrup Take 5 mLs by mouth every 6 (six) hours as needed for cough. Patient not taking: Reported on 03/05/2016 11/24/15   Anita Drown, MD    Family History Family History  Problem Relation Age of Onset  . Stroke Mother   . Heart disease Father   . Heart attack Father     Social History Social History  Substance Use Topics  . Smoking status: Never Smoker  . Smokeless tobacco: Never Used  . Alcohol use No     Allergies   Aspirin; Lisinopril; and Nsaids   Review of Systems Review of Systems 10/14 systems reviewed and are negative other than those stated in the HPI   Physical Exam Updated Vital Signs BP 140/74   Pulse 66   Temp 98.6 F (37 C) (Oral)   Resp 15   Ht 5' 7.5" (1.715 m)   Wt 185 lb (83.9 kg)   LMP 09/18/2012   SpO2 92%   BMI 28.55 kg/m   Physical Exam Physical Exam  Nursing note and vitals reviewed. Constitutional: Well developed, well nourished, non-toxic, and in no acute distress Head: Normocephalic and atraumatic.  Mouth/Throat: Oropharynx is clear and moist.  Eye: Extraocular movements intact. Pupils equal and reactive to light. Normal external eye exam. Visual acuity of the left eye 20/50 and of the right eye 20/30, reported baseline per  patient. Limited funduscopic exam with visualization of potential vitreal hemorrhage. Neck: Normal range of motion. Neck supple.  Cardiovascular: Normal rate and regular rhythm.   Pulmonary/Chest: Effort normal and breath sounds normal.  Abdominal: Soft. There is no tenderness. There is no rebound and no guarding.  Musculoskeletal: Normal range of motion.  Neurological: Alert, no facial droop, fluent speech, moves all extremities symmetrically Skin: Skin is warm and dry.  Psychiatric: Cooperative   ED Treatments / Results  Labs (all labs ordered are listed, but only abnormal results are displayed) Labs Reviewed - No data to display  EKG  EKG  Interpretation None       Radiology No results found.  Procedures Procedures (including critical care time)  Medications Ordered in ED Medications - No data to display   Initial Impression / Assessment and Plan / ED Course  I have reviewed the triage vital signs and the nursing notes.  Pertinent labs & imaging results that were available during my care of the patient were reviewed by me and considered in my medical decision making (see chart for details).  Clinical Course   EMERGENCY DEPARTMENT [place] OCULAR EXAM "Study: Limited Ultrasound of Orbit "  INDICATIONS: Floaters/Flashes  Linear probe utilized to obtain images in both long and short axis of the orbit having the patient look left and right if possible.  PERFORMED BY: Myself  IMAGES ARCHIVED?: Yes  LIMITATIONS: n/a  VIEWS USED: Left orbit  INTERPRETATION: No retinal detachment  Symptoms concerning for retinal detachment, although no definite detachment on ultrasound or significant vitreous hemorrhage. Fundoscopic exam with ? Of some vitreous hemorrhage. Spoke with Dr. Katy Shea, who came to ED to examine. No retinal detachment but with vitreous detachment. To follow-up in clinic in 1-2 weeks for recheck. Strict return and follow-up instructions reviewed. She and husband expressed understanding of all discharge instructions and felt comfortable with the plan of care.   Final Clinical Impressions(s) / ED Diagnoses   Final diagnoses:  Vitreous detachment of left eye    New Prescriptions New Prescriptions   No medications on file     Anita Dandy, MD 03/05/16 1900

## 2016-03-05 NOTE — ED Notes (Signed)
Opthamologist at bedside.   

## 2016-03-05 NOTE — Consult Note (Signed)
Ophthalmology Initial Consult Note  Anita Shea, Anita Shea, 55 y.o. female Date of Service:  03/05/2016  Requesting physician: Forde Dandy, MD  Information Obtained from: patient Chief Complaint:  Floaters,flashes OS  HPI/Discussion:  Anita Shea is a 55 y.o. female with PMHx of controlled HTN (HCTZ) and no POHx (aside from contact lens wear) who presents with new floaters OS this morning and initially constant flashes inferotemporally. Floaters are like black dots and persist, while flashes have resolved. She has never experienced anything like this before. She denies HAs or eye pain. She denies curtains. She has no complaints OD.  Past Ocular Hx:  Wears contact lenses Ocular Meds:  None Family ocular history: Macular degeneration (maternal grandmother)  Past Medical History:  Diagnosis Date  . Hypertension    Past Surgical History:  Procedure Laterality Date  . CHOLECYSTECTOMY      Prior to Admission Meds: HCTZ  Allergies  Allergen Reactions  . Aspirin Anaphylaxis  . Lisinopril Cough  . Nsaids Hives   Social History  Substance Use Topics  . Smoking status: Never Smoker  . Smokeless tobacco: Never Used  . Alcohol use No   Family History  Problem Relation Age of Onset  . Stroke Mother   . Heart disease Father   . Heart attack Father     ROS: Other than ROS in the HPI, all other systems were negative.  Exam: Temp: 98.6 F (37 C) Pulse Rate: 66 BP: 140/74 Resp: 15 SpO2: 92 %  Visual Acuity:  Near (uncorrected)   OD 20/25   OS 20/30+     OD OS  Confr Vis Fields Full to hand movement Full to hand movement  EOM (Primary) Full Full  Lids/Lashes Normal Normal  Conjunctiva - Bulbar White,quiet White,quiet  Conjunctiva - Palpebral               White,quiet White,quiet  Adnexa  Normal Normal  Pupils  5 --> 3,somewhat brisk, no rAPD 5 --> 3,somewhat brisk, no rAPD  Cornea  Clear Clear  Anterior Chamber Formed Formed  Lens:  Clear Clear  IOP 13 15  Fundus -  Dilated? YES   Optic Disc - C:D Ratio 0.4 0.4  Post Seg:  Retina                    Vessels Normal caliber Normal caliber                  Vitreous  Clear Clear, floaters                  Macula Normal, good foveal reflex Normal, good foveal reflex                  Periphery Normal, no holes or tears No holes or tears on depressed exam; pigment clump temporally   Neuro:  Oriented to person, place, and time:  Yes Psychiatric:  Mood and Affect Appropriate:  Yes  Labs/imaging: None  A/P:  55 y.o. female with floaters and intermittent flashes c/w posterior vitreous detachment OS.  1) PVD OS - There is a small pigment clump temporal retina, but there are no holes or tears. - Reviewed s/s of retinal detachment (worsening floaters, constant flashes,curtain of darkness) and advised pt to call ASAP if any new/worsening concern. - Recommend repeat dilated exam in a few weeks.  Pt instructed to call for an appointment with me in clinic in 1-2 weeks.  R Wyatt Portela, MD [facility]  [place] [address] [place], [place] [address] [phone]  R Wyatt Portela, MD 03/05/2016, 6:58 PM

## 2016-03-05 NOTE — ED Triage Notes (Signed)
Pt states she is having visual changes.  Pt is having black floating spots in vision.  Started at 12 noon.  Pt told to come here by her optometrist.  Pt denies head trauma.  No pain.

## 2016-03-05 NOTE — ED Notes (Signed)
MD at bedside. 

## 2016-03-05 NOTE — Discharge Instructions (Signed)
Return for worsening symptoms, including loss of vision or any other symptoms concerning to you.  Please follow-up in Dr. Zenia Resides office in 1-2 weeks for re-evaluation.

## 2016-03-18 ENCOUNTER — Other Ambulatory Visit: Payer: Self-pay | Admitting: Family Medicine

## 2016-04-15 ENCOUNTER — Telehealth: Payer: Self-pay | Admitting: Family Medicine

## 2016-04-15 ENCOUNTER — Other Ambulatory Visit: Payer: Self-pay | Admitting: *Deleted

## 2016-04-15 MED ORDER — HYDROCHLOROTHIAZIDE 25 MG PO TABS: 25.0000 mg | ORAL_TABLET | Freq: Every day | ORAL | 2 refills | Status: DC

## 2016-04-15 MED ORDER — POTASSIUM CHLORIDE ER 10 MEQ PO TBCR: 20.0000 meq | EXTENDED_RELEASE_TABLET | Freq: Every day | ORAL | 2 refills | Status: DC

## 2016-04-15 NOTE — Telephone Encounter (Signed)
Pt is wanting to know if her hydrochlorothiazide (HYDRODIURIL) 25 MG tablet  And potassium chloride (K-DUR) 10 MEQ tablet Can be filled for two months. Pt stated that she is unable to make an appt until after 06/10/16 due to work. Please advise.     [facility]

## 2016-04-15 NOTE — Telephone Encounter (Signed)
She may have refill with 2 additional refills enough to last her into January then can follow-up in January

## 2016-04-15 NOTE — Telephone Encounter (Signed)
Discussed with pt. meds sent to pharm.  

## 2016-07-29 ENCOUNTER — Other Ambulatory Visit (HOSPITAL_COMMUNITY)
Admission: RE | Admit: 2016-07-29 | Discharge: 2016-07-29 | Disposition: A | Discharge status: Home / Self Care | Payer: 59 | Source: Ambulatory Visit | Attending: Family Medicine | Admitting: Family Medicine

## 2016-07-29 ENCOUNTER — Ambulatory Visit (INDEPENDENT_AMBULATORY_CARE_PROVIDER_SITE_OTHER): Payer: 59 | Admitting: Family Medicine

## 2016-07-29 ENCOUNTER — Encounter: Payer: Self-pay | Admitting: Family Medicine

## 2016-07-29 ENCOUNTER — Ambulatory Visit (HOSPITAL_COMMUNITY)
Admission: RE | Admit: 2016-07-29 | Discharge: 2016-07-29 | Disposition: A | Discharge status: Home / Self Care | Payer: 59 | Source: Ambulatory Visit | Attending: Family Medicine | Admitting: Family Medicine

## 2016-07-29 VITALS — BP 132/80 | Temp 99.5°F | Ht 67.5 in | Wt 193.0 lb

## 2016-07-29 DIAGNOSIS — R109 Unspecified abdominal pain: Secondary | ICD-10-CM | POA: Insufficient documentation

## 2016-07-29 DIAGNOSIS — K5732 Diverticulitis of large intestine without perforation or abscess without bleeding: Secondary | ICD-10-CM | POA: Insufficient documentation

## 2016-07-29 DIAGNOSIS — R630 Anorexia: Secondary | ICD-10-CM | POA: Insufficient documentation

## 2016-07-29 DIAGNOSIS — Z9049 Acquired absence of other specified parts of digestive tract: Secondary | ICD-10-CM | POA: Diagnosis not present

## 2016-07-29 DIAGNOSIS — D1771 Benign lipomatous neoplasm of kidney: Secondary | ICD-10-CM | POA: Insufficient documentation

## 2016-07-29 DIAGNOSIS — K5792 Diverticulitis of intestine, part unspecified, without perforation or abscess without bleeding: Secondary | ICD-10-CM

## 2016-07-29 DIAGNOSIS — R509 Fever, unspecified: Secondary | ICD-10-CM

## 2016-07-29 DIAGNOSIS — K449 Diaphragmatic hernia without obstruction or gangrene: Secondary | ICD-10-CM | POA: Diagnosis not present

## 2016-07-29 LAB — BASIC METABOLIC PANEL
Anion gap: 10 (ref 5–15)
BUN: 13 mg/dL (ref 6–20)
CHLORIDE: 99 mmol/L — AB (ref 101–111)
CO2: 28 mmol/L (ref 22–32)
Calcium: 9.6 mg/dL (ref 8.9–10.3)
Creatinine, Ser: 0.85 mg/dL (ref 0.44–1.00)
GFR calc Af Amer: >60 mL/min (ref >60)
GFR calc non Af Amer: >60 mL/min (ref >60)
GLUCOSE: 109 mg/dL — AB (ref 65–99)
POTASSIUM: 3.3 mmol/L — AB (ref 3.5–5.1)
Sodium: 137 mmol/L (ref 135–145)

## 2016-07-29 LAB — CBC WITH DIFFERENTIAL/PLATELET
Basophils Absolute: 0 10*3/uL (ref 0.0–0.1)
Basophils Relative: 0 %
EOS PCT: 0 %
Eosinophils Absolute: 0 10*3/uL (ref 0.0–0.7)
HEMATOCRIT: 41.2 % (ref 36.0–46.0)
HEMOGLOBIN: 13.8 g/dL (ref 12.0–15.0)
LYMPHS PCT: 16 %
Lymphs Abs: 2.2 10*3/uL (ref 0.7–4.0)
MCH: 29.6 pg (ref 26.0–34.0)
MCHC: 33.5 g/dL (ref 30.0–36.0)
MCV: 88.2 fL (ref 78.0–100.0)
Monocytes Absolute: 1 10*3/uL (ref 0.1–1.0)
Monocytes Relative: 7 %
Neutro Abs: 10.1 10*3/uL — ABNORMAL HIGH (ref 1.7–7.7)
Neutrophils Relative %: 77 %
Platelets: 280 10*3/uL (ref 150–400)
RBC: 4.67 MIL/uL (ref 3.87–5.11)
RDW: 13 % (ref 11.5–15.5)
WBC: 13.3 10*3/uL — AB (ref 4.0–10.5)

## 2016-07-29 LAB — POCT URINALYSIS DIPSTICK: pH, UA: 7

## 2016-07-29 LAB — AMYLASE: Amylase: 51 U/L (ref 28–100)

## 2016-07-29 LAB — HEPATIC FUNCTION PANEL
ALK PHOS: 70 U/L (ref 38–126)
ALT: 26 U/L (ref 14–54)
AST: 24 U/L (ref 15–41)
Albumin: 4.4 g/dL (ref 3.5–5.0)
BILIRUBIN DIRECT: 0.2 mg/dL (ref 0.1–0.5)
BILIRUBIN INDIRECT: 0.7 mg/dL (ref 0.3–0.9)
Total Bilirubin: 0.9 mg/dL (ref 0.3–1.2)
Total Protein: 8.5 g/dL — ABNORMAL HIGH (ref 6.5–8.1)

## 2016-07-29 MED ORDER — METRONIDAZOLE 500 MG PO TABS: 500.0000 mg | ORAL_TABLET | Freq: Three times a day (TID) | ORAL | 0 refills | Status: DC

## 2016-07-29 MED ORDER — IOPAMIDOL (ISOVUE-300) INJECTION 61%
100.0000 mL | Freq: Once | INTRAVENOUS | Status: AC | PRN
  Administered 2016-07-29: 100 mL via INTRAVENOUS

## 2016-07-29 MED ORDER — ONDANSETRON 4 MG PO TBDP: 4.0000 mg | ORAL_TABLET | Freq: Three times a day (TID) | ORAL | 0 refills | Status: DC | PRN

## 2016-07-29 MED ORDER — CIPROFLOXACIN HCL 500 MG PO TABS: 500.0000 mg | ORAL_TABLET | Freq: Two times a day (BID) | ORAL | 0 refills | Status: AC

## 2016-07-29 NOTE — Progress Notes (Signed)
   Subjective:    Patient ID: Anita Shea, female    DOB: March 25, 1961, 56 y.o.   MRN: [id number]  Abdominal Pain  This is a new problem. Episode onset: 24 hours. Pain location: low abdominal pain, low back pain. Associated symptoms include a fever. Treatments tried: tylenol, gas x.   Results for orders placed or performed in visit on 07/29/16  POCT urinalysis dipstick  Result Value Ref Range   Color, UA     Clarity, UA     Glucose, UA     Bilirubin, UA     Ketones, UA     Spec Grav, UA <=1.005    Blood, UA     pH, UA 7.0    Protein, UA     Urobilinogen, UA     Nitrite, UA     Leukocytes, UA  Negative    yest felt fine, later in the day stomach did not feel well  Pos pain later in the day  huritng bad   Took tylenol. Prn  At times spasm like pain  No diarrhea  No vomiting  No hx of uti's    Thru low back also   Positive nausea or queaziness   Of note pain started yesterday. Low abdomen. Has worsened through the night. At times cramping discomfort. At times just a deep ache. Next  Deftly radiates to the back.  Positive nausea. No actual vomiting.  No high fevers. Did note some low-grade fever upon checking.  Has not had a colonoscopy.   No dysuria no history kidney and infections    Review of Systems  Constitutional: Positive for fever.  Gastrointestinal: Positive for abdominal pain.       Objective:   Physical Exam Alert slight malaise. HEENT normal lungs clear heart rare rhythm abdomen good bowel sounds definite suprapubic tenderness left greater than right. No rebound no guarding. No CVA tenderness. No spinal tenderness question low back tenderness to percussion.  Urinalysis completely normal       Assessment & Plan:  Impression abdominal pain low abdomen. Has never had a colonoscopy. Fairly severe times associated nausea. Also fever. Radiating to the back. Need to consider appendicitis versus diverticulitis. Diverticulitis highest on  the list.  Addendum. Lab results return. White blood count 13,000. Potassium borderline low at 3.3.  After-hours CT scan return. I spoke with folks at radiology twice. In and had another lengthy discussion with the patient. In total 40 minutes spent with the patient today. Greater than 50% Most in discussion regarding the nature of condition symptoms workup diagnosis management and prognosis. Scan revealed diverticulitis. Given Cipro twice a day 10 days. Flagyl 3 times a day 10 days. Warning signs discussed. Zofran when necessary for nausea. Follow-up with Dr. Nicki Reaper Tuesday or Wednesday. Dietary measures discussed

## 2016-08-02 ENCOUNTER — Ambulatory Visit (INDEPENDENT_AMBULATORY_CARE_PROVIDER_SITE_OTHER): Payer: 59 | Admitting: Family Medicine

## 2016-08-02 ENCOUNTER — Encounter: Payer: Self-pay | Admitting: Family Medicine

## 2016-08-02 VITALS — BP 110/80 | Temp 98.2°F | Ht 67.5 in | Wt 195.4 lb

## 2016-08-02 DIAGNOSIS — K5732 Diverticulitis of large intestine without perforation or abscess without bleeding: Secondary | ICD-10-CM

## 2016-08-02 DIAGNOSIS — E781 Pure hyperglyceridemia: Secondary | ICD-10-CM | POA: Diagnosis not present

## 2016-08-02 DIAGNOSIS — E876 Hypokalemia: Secondary | ICD-10-CM | POA: Diagnosis not present

## 2016-08-02 NOTE — Progress Notes (Signed)
   Subjective:    Patient ID: Anita Shea, female    DOB: Jan 24, 1961, 56 y.o.   MRN: [id number]  HPI Patient is here today for a follow up visit on diverticulitis.  Patient states that she still doesn't feel good. Not sure of what she should be eating.   Abdominal pain has improved but not resolved. Currently taking antibiotics. Patient denies any vomiting denies rectal bleeding. States pain levels are diminished compared where they were antibiotics causing some nausea  Patient states that her potassium is low according to her bloodwork and she needs to know how to increase this.   Review of Systems Some nausea some lower abdominal pain denies rectal bleeding mucousy stools denies vomiting fever chills    Objective:   Physical Exam  Lungs are clear hearts regular abdomen soft subjective lower abdominal discomfort no guarding or rebound extremities no edema Patient has been counseled in the past to do a colonoscopy she was counseled again today     Assessment & Plan:  Low potassium probably related to recent sickness repeat potassium Screening cholesterol Diverticulitis gradually getting better finish antibiotics if not totally well will need a second round of antibiotics Referral for colonoscopy patient will need to get this done later this spring after she is healed up from the diverticulitis

## 2016-08-12 ENCOUNTER — Encounter (INDEPENDENT_AMBULATORY_CARE_PROVIDER_SITE_OTHER): Payer: Self-pay | Admitting: Internal Medicine

## 2016-08-12 ENCOUNTER — Encounter (INDEPENDENT_AMBULATORY_CARE_PROVIDER_SITE_OTHER): Payer: Self-pay

## 2016-08-15 ENCOUNTER — Other Ambulatory Visit (HOSPITAL_COMMUNITY)
Admission: RE | Admit: 2016-08-15 | Discharge: 2016-08-15 | Disposition: A | Discharge status: Home / Self Care | Payer: 59 | Source: Ambulatory Visit | Attending: Family Medicine | Admitting: Family Medicine

## 2016-08-15 DIAGNOSIS — E781 Pure hyperglyceridemia: Secondary | ICD-10-CM | POA: Insufficient documentation

## 2016-08-15 DIAGNOSIS — E876 Hypokalemia: Secondary | ICD-10-CM | POA: Insufficient documentation

## 2016-08-15 DIAGNOSIS — K5732 Diverticulitis of large intestine without perforation or abscess without bleeding: Secondary | ICD-10-CM | POA: Diagnosis not present

## 2016-08-15 LAB — CBC WITH DIFFERENTIAL/PLATELET
Basophils Absolute: 0 10*3/uL (ref 0.0–0.1)
Basophils Relative: 1 %
EOS ABS: 0.1 10*3/uL (ref 0.0–0.7)
Eosinophils Relative: 2 %
HCT: 42.5 % (ref 36.0–46.0)
HEMOGLOBIN: 14.4 g/dL (ref 12.0–15.0)
Lymphocytes Relative: 30 %
Lymphs Abs: 2 10*3/uL (ref 0.7–4.0)
MCH: 29.9 pg (ref 26.0–34.0)
MCHC: 33.9 g/dL (ref 30.0–36.0)
MCV: 88.4 fL (ref 78.0–100.0)
MONO ABS: 0.4 10*3/uL (ref 0.1–1.0)
MONOS PCT: 6 %
NEUTROS PCT: 61 %
Neutro Abs: 4.1 10*3/uL (ref 1.7–7.7)
Platelets: 321 10*3/uL (ref 150–400)
RBC: 4.81 MIL/uL (ref 3.87–5.11)
RDW: 13.1 % (ref 11.5–15.5)
WBC: 6.6 10*3/uL (ref 4.0–10.5)

## 2016-08-15 LAB — BASIC METABOLIC PANEL
ANION GAP: 9 (ref 5–15)
BUN: 11 mg/dL (ref 6–20)
CALCIUM: 9.4 mg/dL (ref 8.9–10.3)
CO2: 27 mmol/L (ref 22–32)
Chloride: 100 mmol/L — ABNORMAL LOW (ref 101–111)
Creatinine, Ser: 0.85 mg/dL (ref 0.44–1.00)
Glucose, Bld: 130 mg/dL — ABNORMAL HIGH (ref 65–99)
POTASSIUM: 3.2 mmol/L — AB (ref 3.5–5.1)
Sodium: 136 mmol/L (ref 135–145)

## 2016-08-15 LAB — LIPID PANEL
CHOLESTEROL: 172 mg/dL (ref 0–200)
HDL: 50 mg/dL (ref >40)
LDL CALC: 101 mg/dL — AB (ref 0–99)
Total CHOL/HDL Ratio: 3.4 RATIO
Triglycerides: 107 mg/dL (ref <150)
VLDL: 21 mg/dL (ref 0–40)

## 2016-08-15 LAB — MAGNESIUM: Magnesium: 2 mg/dL (ref 1.7–2.4)

## 2016-08-16 ENCOUNTER — Telehealth: Payer: Self-pay | Admitting: Family Medicine

## 2016-08-16 NOTE — Telephone Encounter (Signed)
I did discuss the results of the tests with the patient plus also her ongoing left lower quadrant pain she will call us back if ongoing pain nurses please see result note the patient does need new prescription for her potassium called in since I did increase the dose thank you very much-please be aware the patient is aware that we are sending in the new prescription for the potassium she is aware of the new instructions plus also the need to do a repeat metabolic 7 in 2 weeks

## 2016-08-16 NOTE — Telephone Encounter (Signed)
Patient had labs yesterday.  Calling for results and wanted to let Dr. Nicki Reaper know that she is still having abdominal pain.  Patient has GI appt. 08-30-16  Please call pt at [phone].

## 2016-08-16 NOTE — Telephone Encounter (Signed)
Dr.Scott to speak with patient. Patient stated Dr.scott can call her back when he has time.

## 2016-08-16 NOTE — Telephone Encounter (Signed)
Patient's potassium is low. Please have the patient speak with me.

## 2016-08-17 ENCOUNTER — Other Ambulatory Visit: Payer: Self-pay | Admitting: *Deleted

## 2016-08-17 DIAGNOSIS — Z79899 Other long term (current) drug therapy: Secondary | ICD-10-CM

## 2016-08-17 MED ORDER — POTASSIUM CHLORIDE ER 10 MEQ PO TBCR: 20.0000 meq | EXTENDED_RELEASE_TABLET | Freq: Two times a day (BID) | ORAL | 5 refills | Status: DC

## 2016-08-17 NOTE — Telephone Encounter (Signed)
New dose of potassium sent to pharm. Order for met 7 put in system.

## 2016-08-19 ENCOUNTER — Telehealth: Payer: Self-pay | Admitting: Family Medicine

## 2016-08-19 NOTE — Telephone Encounter (Signed)
Please let the patient know that I would stick with a soft root soft vegetable diet in lean meats well chewed-avoid knots keep appointment with gastroenterology if reoccurrence of abdominal discomfort please let us know

## 2016-08-19 NOTE — Telephone Encounter (Signed)
Pt calling to state she's feeling much better, pain is gone  Still eating carefully

## 2016-08-19 NOTE — Telephone Encounter (Signed)
Discussed with pt. Pt verbalized understanding.  °

## 2016-08-24 ENCOUNTER — Encounter: Payer: Self-pay | Admitting: Family Medicine

## 2016-08-24 ENCOUNTER — Other Ambulatory Visit (HOSPITAL_COMMUNITY)
Admission: RE | Admit: 2016-08-24 | Discharge: 2016-08-24 | Disposition: A | Discharge status: Home / Self Care | Payer: 59 | Source: Ambulatory Visit | Attending: Family Medicine | Admitting: Family Medicine

## 2016-08-24 ENCOUNTER — Ambulatory Visit (INDEPENDENT_AMBULATORY_CARE_PROVIDER_SITE_OTHER): Payer: 59 | Admitting: Family Medicine

## 2016-08-24 VITALS — BP 132/90 | Temp 99.4°F | Ht 67.5 in | Wt 185.0 lb

## 2016-08-24 DIAGNOSIS — E876 Hypokalemia: Secondary | ICD-10-CM

## 2016-08-24 DIAGNOSIS — K5732 Diverticulitis of large intestine without perforation or abscess without bleeding: Secondary | ICD-10-CM

## 2016-08-24 DIAGNOSIS — R1084 Generalized abdominal pain: Secondary | ICD-10-CM | POA: Insufficient documentation

## 2016-08-24 LAB — CBC WITH DIFFERENTIAL/PLATELET
Basophils Absolute: 0 10*3/uL (ref 0.0–0.1)
Basophils Relative: 0 %
Eosinophils Absolute: 0.1 10*3/uL (ref 0.0–0.7)
Eosinophils Relative: 1 %
HEMATOCRIT: 42.5 % (ref 36.0–46.0)
HEMOGLOBIN: 14.5 g/dL (ref 12.0–15.0)
LYMPHS ABS: 2.2 10*3/uL (ref 0.7–4.0)
Lymphocytes Relative: 21 %
MCH: 30.1 pg (ref 26.0–34.0)
MCHC: 34.1 g/dL (ref 30.0–36.0)
MCV: 88.4 fL (ref 78.0–100.0)
MONOS PCT: 8 %
Monocytes Absolute: 0.8 10*3/uL (ref 0.1–1.0)
NEUTROS ABS: 7.7 10*3/uL (ref 1.7–7.7)
NEUTROS PCT: 70 %
Platelets: 278 10*3/uL (ref 150–400)
RBC: 4.81 MIL/uL (ref 3.87–5.11)
RDW: 13 % (ref 11.5–15.5)
WBC: 10.9 10*3/uL — ABNORMAL HIGH (ref 4.0–10.5)

## 2016-08-24 LAB — BASIC METABOLIC PANEL
Anion gap: 8 (ref 5–15)
BUN: 9 mg/dL (ref 6–20)
CALCIUM: 9.2 mg/dL (ref 8.9–10.3)
CHLORIDE: 99 mmol/L — AB (ref 101–111)
CO2: 27 mmol/L (ref 22–32)
CREATININE: 0.79 mg/dL (ref 0.44–1.00)
GFR calc Af Amer: >60 mL/min (ref >60)
GFR calc non Af Amer: >60 mL/min (ref >60)
GLUCOSE: 102 mg/dL — AB (ref 65–99)
Potassium: 3.2 mmol/L — ABNORMAL LOW (ref 3.5–5.1)
Sodium: 134 mmol/L — ABNORMAL LOW (ref 135–145)

## 2016-08-24 LAB — HEPATIC FUNCTION PANEL
ALBUMIN: 4.7 g/dL (ref 3.5–5.0)
ALK PHOS: 69 U/L (ref 38–126)
ALT: 25 U/L (ref 14–54)
AST: 24 U/L (ref 15–41)
BILIRUBIN DIRECT: 0.1 mg/dL (ref 0.1–0.5)
BILIRUBIN INDIRECT: 0.5 mg/dL (ref 0.3–0.9)
BILIRUBIN TOTAL: 0.6 mg/dL (ref 0.3–1.2)
Total Protein: 8.9 g/dL — ABNORMAL HIGH (ref 6.5–8.1)

## 2016-08-24 LAB — LIPASE, BLOOD: LIPASE: 16 U/L (ref 11–51)

## 2016-08-24 MED ORDER — CIPROFLOXACIN HCL 500 MG PO TABS: 500.0000 mg | ORAL_TABLET | Freq: Two times a day (BID) | ORAL | 0 refills | Status: DC

## 2016-08-24 MED ORDER — METRONIDAZOLE 500 MG PO TABS: 500.0000 mg | ORAL_TABLET | Freq: Three times a day (TID) | ORAL | 0 refills | Status: DC

## 2016-08-24 MED ORDER — HYDROCODONE-ACETAMINOPHEN 10-325 MG PO TABS: 1.0000 | ORAL_TABLET | ORAL | 0 refills | Status: DC | PRN

## 2016-08-24 MED ORDER — POTASSIUM CHLORIDE ER 10 MEQ PO TBCR: 20.0000 meq | EXTENDED_RELEASE_TABLET | Freq: Three times a day (TID) | ORAL | 5 refills | Status: DC

## 2016-08-24 NOTE — Progress Notes (Signed)
   Subjective:    Patient ID: Charlestine Massed, female    DOB: 1961/02/16, 56 y.o.   MRN: [id number]  HPIFollow up on diverticulitis. Getting worse. Seeing NP at Dr. Olevia Perches office next Tuesday. Low grade fever, abd pain. Taking a strong probiotic. Finished antibiotic.     Review of Systems     Objective:   Physical Exam    Lower abdominal tenderness worse on the mid and right side than the left side upper abdomen minimal tenderness lungs clear heart regular    Assessment & Plan:  I discussed the case with gastroenterology Cipro 500 twice a day for 2 weeks, Flagyl 500 mg 3 times a day for 2 weeks, full liquid diet, stat labs ordered, high fevers vomiting or severe abdominal pain go to ER immediately, may need CT scan, follow-up with gastroenterology next week  Lab work looked reassuring white blood count is up potassium is up we will increase of potassium in addition to this she will notify us on Friday how she is doing she'll follow-up with gastroenterology next week she she is aware to go to the ER if high fevers vomiting are worse because she would need a CAT scan

## 2016-08-25 ENCOUNTER — Telehealth: Payer: Self-pay | Admitting: Family Medicine

## 2016-08-25 ENCOUNTER — Other Ambulatory Visit: Payer: Self-pay

## 2016-08-25 DIAGNOSIS — E876 Hypokalemia: Secondary | ICD-10-CM

## 2016-08-25 MED ORDER — ONDANSETRON 8 MG PO TBDP: 8.0000 mg | ORAL_TABLET | Freq: Three times a day (TID) | ORAL | 2 refills | Status: DC | PRN

## 2016-08-25 NOTE — Telephone Encounter (Signed)
Yes she can use that every 8 hours please send then 3 additional refills on the Zofran

## 2016-08-25 NOTE — Telephone Encounter (Signed)
See other telephone message concerning this.

## 2016-08-25 NOTE — Telephone Encounter (Signed)
Notified patient med sent to pharmacy.  

## 2016-08-25 NOTE — Telephone Encounter (Signed)
Patient said the antibiotics really make her nauseated.  Wanted to know if it is okay to take the nausea meds on a regular basis, every 8 hrs?

## 2016-08-25 NOTE — Telephone Encounter (Signed)
Please talk with the patient regarding the nausea medicine she has choices. Zofran does, as a 8 mg tablet, if she would like to use that it's 1 3 times daily as needed for nausea, #30, 3 refills it also comes as it disintegrating tablet if she would prefer that

## 2016-08-26 ENCOUNTER — Telehealth: Payer: Self-pay | Admitting: Family Medicine

## 2016-08-26 NOTE — Telephone Encounter (Signed)
Pt is still on the full liquid diet. Pt states that she is not in extreme pain but still has some pain. Pt is only eating cream of wheat,jello and chicken broth. Pt is also still taking the nausea meds. Pt was told to call and let the Dr. Gwyndolyn Kaufman how she was doing.

## 2016-08-29 ENCOUNTER — Encounter: Payer: Self-pay | Admitting: Family Medicine

## 2016-08-30 ENCOUNTER — Ambulatory Visit (INDEPENDENT_AMBULATORY_CARE_PROVIDER_SITE_OTHER): Payer: 59 | Admitting: Internal Medicine

## 2016-08-30 ENCOUNTER — Encounter (INDEPENDENT_AMBULATORY_CARE_PROVIDER_SITE_OTHER): Payer: Self-pay | Admitting: *Deleted

## 2016-08-30 ENCOUNTER — Encounter (INDEPENDENT_AMBULATORY_CARE_PROVIDER_SITE_OTHER): Payer: Self-pay | Admitting: Internal Medicine

## 2016-08-30 VITALS — BP 128/80 | HR 60 | Temp 98.3°F | Ht 67.5 in | Wt 184.3 lb

## 2016-08-30 DIAGNOSIS — K5732 Diverticulitis of large intestine without perforation or abscess without bleeding: Secondary | ICD-10-CM

## 2016-08-30 HISTORY — DX: Diverticulitis of large intestine without perforation or abscess without bleeding: K57.32

## 2016-08-30 NOTE — Patient Instructions (Addendum)
CT abdomen/pelvis with CM.  

## 2016-08-30 NOTE — Progress Notes (Signed)
Subjective:    Patient ID: Anita Shea, female    DOB: 01/29/1961, 56 y.o.   MRN: [id number]   HPI Referred by Dr. Sallee Lange for diverticulitis. She says on February 1st she had lower abdominal pain. She saw Dr. Lurena Joiner 07/29/2016 and had labs and CT scan.      CT which revealed sigmoid diverticulitis.  Covered with Cipro and Flagyl x 10 days. She was on a soft diet in the beginning x 2 weeks. She says she has not gotten any better. She saw Dr Wolfgang Phoenix last week and was covered again with Cipro and Flagyl x 14 days. Has been on this round of antibiotics x 6 days.  She is now on a clear liquid diet. She says she still has some lower abdominal pain.   She rates her pain 6/10 at this time. Normally she has a BM daily.  Now she is having every 2-3 days. No urinary symptoms.  States she is 25-30% better  She has never undergone a colonoscopy.   07/29/2016 CT abdomen/pelvis with CM: pelvic pain x 24 hrs.   IMPRESSION: 1. Uncomplicated sigmoid diverticulitis. 2. No other significant abdominal/pelvic findings. 3. Status post cholecystectomy. 4. Stable small angiomyolipoma associated with the midpole region of left kidney.  CBC    Component Value Date/Time   WBC 10.9 (H) 08/24/2016 1520   RBC 4.81 08/24/2016 1520   HGB 14.5 08/24/2016 1520   HCT 42.5 08/24/2016 1520   PLT 278 08/24/2016 1520   MCV 88.4 08/24/2016 1520   MCH 30.1 08/24/2016 1520   MCHC 34.1 08/24/2016 1520   RDW 13.0 08/24/2016 1520   LYMPHSABS 2.2 08/24/2016 1520   MONOABS 0.8 08/24/2016 1520   EOSABS 0.1 08/24/2016 1520   BASOSABS 0.0 08/24/2016 1520    Hepatic Function Latest Ref Rng & Units 08/24/2016 07/29/2016  Total Protein 6.5 - 8.1 g/dL 8.9(H) 8.5(H)  Albumin 3.5 - 5.0 g/dL 4.7 4.4  AST 15 - 41 U/L 24 24  ALT 14 - 54 U/L 25 26  Alk Phosphatase 38 - 126 U/L 69 70  Total Bilirubin 0.3 - 1.2 mg/dL 0.6 0.9  Bilirubin, Direct 0.1 - 0.5 mg/dL 0.1 0.2     Review of Systems Past Medical History:  Diagnosis  Date  . Hypertension     Past Surgical History:  Procedure Laterality Date  . CHOLECYSTECTOMY      Allergies  Allergen Reactions  . Aspirin Anaphylaxis  . Lisinopril Cough  . Nsaids Hives    Current Outpatient Prescriptions on File Prior to Visit  Medication Sig Dispense Refill  . Calcium Carb-Cholecalciferol (CALCIUM + D3 PO) Take 1 tablet by mouth daily.     . ciprofloxacin (CIPRO) 500 MG tablet Take 1 tablet (500 mg total) by mouth 2 (two) times daily. 28 tablet 0  . fluticasone (FLONASE) 50 MCG/ACT nasal spray Place into both nostrils daily.    . metroNIDAZOLE (FLAGYL) 500 MG tablet Take 1 tablet (500 mg total) by mouth 3 (three) times daily. 42 tablet 0  . ondansetron (ZOFRAN ODT) 8 MG disintegrating tablet Take 1 tablet (8 mg total) by mouth 3 (three) times daily as needed for nausea or vomiting. 21 tablet 2  . Probiotic Product (PROBIOTIC DAILY PO) Take by mouth. culturelle 20,000 billion one daily    . ranitidine (ZANTAC) 300 MG tablet Take 300 mg by mouth at bedtime.    . Simethicone (GAS-X PO) Take by mouth.    . hydrochlorothiazide (HYDRODIURIL) 25 MG  tablet Take 1 tablet (25 mg total) by mouth daily. (Patient not taking: Reported on 08/30/2016) 30 tablet 2  . HYDROcodone-acetaminophen (NORCO) 10-325 MG tablet Take 1 tablet by mouth every 4 (four) hours as needed. (Patient not taking: Reported on 08/30/2016) 30 tablet 0  . potassium chloride (K-DUR) 10 MEQ tablet Take 2 tablets (20 mEq total) by mouth 3 (three) times daily. (Patient not taking: Reported on 08/30/2016) 180 tablet 5   No current facility-administered medications on file prior to visit.        Objective:   Physical Exam Blood pressure 128/80, pulse 60, temperature 98.3 F (36.8 C), height 5' 7.5" (1.715 m), weight 184 lb 4.8 oz (83.6 kg), last menstrual period 09/18/2012.  Alert and oriented. Skin warm and dry. Oral mucosa is moist.   . Sclera anicteric, conjunctivae is pink. Thyroid not enlarged. No cervical  lymphadenopathy. Lungs clear. Heart regular rate and rhythm.  Abdomen is soft. Bowel sounds are positive. No hepatomegaly. No abdominal masses felt. Tenderness umblical and pelvis.  No edema to lower extremities.  . Dr. Laural Golden in with patient today also.     Assessment & Plan:  Diverticulitis. Am going to repeat a CT abdomen/pelvic with CM to make sure she does not have an abscess or perforation.  Diverticular diet will be given to patient. Once I have CT scan back will schedule a colonoscopy 2 months out.

## 2016-08-31 ENCOUNTER — Encounter: Payer: Self-pay | Admitting: Family Medicine

## 2016-09-01 ENCOUNTER — Telehealth (INDEPENDENT_AMBULATORY_CARE_PROVIDER_SITE_OTHER): Payer: Self-pay | Admitting: *Deleted

## 2016-09-01 ENCOUNTER — Encounter: Payer: Self-pay | Admitting: Family Medicine

## 2016-09-01 NOTE — Telephone Encounter (Signed)
Bland diet.

## 2016-09-01 NOTE — Telephone Encounter (Signed)
Patient is sch'd for CT 3/9 at 5 pm -- she wants to know what she can do as far as food and diet until she gets results of CT please call her @ [phone]

## 2016-09-02 ENCOUNTER — Ambulatory Visit (HOSPITAL_COMMUNITY)
Admission: RE | Admit: 2016-09-02 | Discharge: 2016-09-02 | Disposition: A | Discharge status: Home / Self Care | Payer: 59 | Source: Ambulatory Visit | Attending: Internal Medicine | Admitting: Internal Medicine

## 2016-09-02 ENCOUNTER — Encounter (INDEPENDENT_AMBULATORY_CARE_PROVIDER_SITE_OTHER): Payer: Self-pay | Admitting: Internal Medicine

## 2016-09-02 DIAGNOSIS — K5732 Diverticulitis of large intestine without perforation or abscess without bleeding: Secondary | ICD-10-CM | POA: Diagnosis not present

## 2016-09-02 DIAGNOSIS — D1771 Benign lipomatous neoplasm of kidney: Secondary | ICD-10-CM | POA: Diagnosis not present

## 2016-09-02 MED ORDER — IOPAMIDOL (ISOVUE-300) INJECTION 61%
100.0000 mL | Freq: Once | INTRAVENOUS | Status: AC | PRN
  Administered 2016-09-02: 100 mL via INTRAVENOUS

## 2016-09-06 ENCOUNTER — Telehealth (INDEPENDENT_AMBULATORY_CARE_PROVIDER_SITE_OTHER): Payer: Self-pay | Admitting: Internal Medicine

## 2016-09-06 ENCOUNTER — Other Ambulatory Visit (INDEPENDENT_AMBULATORY_CARE_PROVIDER_SITE_OTHER): Payer: Self-pay | Admitting: Internal Medicine

## 2016-09-06 ENCOUNTER — Encounter (INDEPENDENT_AMBULATORY_CARE_PROVIDER_SITE_OTHER): Payer: Self-pay | Admitting: *Deleted

## 2016-09-06 ENCOUNTER — Telehealth (INDEPENDENT_AMBULATORY_CARE_PROVIDER_SITE_OTHER): Payer: Self-pay | Admitting: *Deleted

## 2016-09-06 DIAGNOSIS — K5732 Diverticulitis of large intestine without perforation or abscess without bleeding: Secondary | ICD-10-CM

## 2016-09-06 MED ORDER — PEG 3350-KCL-NA BICARB-NACL 420 G PO SOLR: 4000.0000 mL | Freq: Once | ORAL | 0 refills | Status: AC

## 2016-09-06 NOTE — Telephone Encounter (Signed)
She is 75% better

## 2016-09-06 NOTE — Telephone Encounter (Signed)
Results given to patient. She feels better but tired. Stools are formed.   Will schedule a colonoscopy in about 10 weeks.

## 2016-09-06 NOTE — Telephone Encounter (Signed)
Order has been placed.

## 2016-09-06 NOTE — Telephone Encounter (Signed)
Patient called in and asked that Dr. Laural Golden call her in reference to results but you actually ordered these.  She wants CT results from Friday and would like a call back  [phone]

## 2016-09-06 NOTE — Telephone Encounter (Signed)
Patient needs trilyte 

## 2016-09-06 NOTE — Telephone Encounter (Signed)
TCS sch'd 11/30/16, patient aware -- please put order in, thanks

## 2016-09-09 ENCOUNTER — Encounter (INDEPENDENT_AMBULATORY_CARE_PROVIDER_SITE_OTHER): Payer: Self-pay | Admitting: Internal Medicine

## 2016-09-16 ENCOUNTER — Other Ambulatory Visit (HOSPITAL_COMMUNITY)
Admission: RE | Admit: 2016-09-16 | Discharge: 2016-09-16 | Disposition: A | Discharge status: Home / Self Care | Payer: 59 | Source: Ambulatory Visit | Attending: Family Medicine | Admitting: Family Medicine

## 2016-09-16 ENCOUNTER — Encounter: Payer: Self-pay | Admitting: Family Medicine

## 2016-09-16 ENCOUNTER — Telehealth: Payer: Self-pay | Admitting: Family Medicine

## 2016-09-16 ENCOUNTER — Other Ambulatory Visit: Payer: Self-pay | Admitting: *Deleted

## 2016-09-16 ENCOUNTER — Ambulatory Visit (INDEPENDENT_AMBULATORY_CARE_PROVIDER_SITE_OTHER): Payer: 59 | Admitting: Family Medicine

## 2016-09-16 DIAGNOSIS — R1030 Lower abdominal pain, unspecified: Secondary | ICD-10-CM | POA: Diagnosis not present

## 2016-09-16 DIAGNOSIS — R197 Diarrhea, unspecified: Secondary | ICD-10-CM

## 2016-09-16 DIAGNOSIS — R109 Unspecified abdominal pain: Secondary | ICD-10-CM | POA: Insufficient documentation

## 2016-09-16 LAB — CBC WITH DIFFERENTIAL/PLATELET
Basophils Absolute: 0 10*3/uL (ref 0.0–0.1)
Basophils Relative: 0 %
EOS ABS: 0.1 10*3/uL (ref 0.0–0.7)
EOS PCT: 1 %
HCT: 41.8 % (ref 36.0–46.0)
Hemoglobin: 14.1 g/dL (ref 12.0–15.0)
LYMPHS ABS: 2.1 10*3/uL (ref 0.7–4.0)
LYMPHS PCT: 20 %
MCH: 29.7 pg (ref 26.0–34.0)
MCHC: 33.7 g/dL (ref 30.0–36.0)
MCV: 88 fL (ref 78.0–100.0)
MONOS PCT: 8 %
Monocytes Absolute: 0.8 10*3/uL (ref 0.1–1.0)
Neutro Abs: 7.6 10*3/uL (ref 1.7–7.7)
Neutrophils Relative %: 71 %
PLATELETS: 256 10*3/uL (ref 150–400)
RBC: 4.75 MIL/uL (ref 3.87–5.11)
RDW: 13.1 % (ref 11.5–15.5)
WBC: 10.7 10*3/uL — AB (ref 4.0–10.5)

## 2016-09-16 MED ORDER — ONDANSETRON 8 MG PO TBDP: 8.0000 mg | ORAL_TABLET | Freq: Three times a day (TID) | ORAL | 2 refills | Status: DC | PRN

## 2016-09-16 MED ORDER — DICYCLOMINE HCL 20 MG PO TABS: 20.0000 mg | ORAL_TABLET | Freq: Three times a day (TID) | ORAL | 2 refills | Status: DC | PRN

## 2016-09-16 MED ORDER — CIPROFLOXACIN HCL 500 MG PO TABS: 500.0000 mg | ORAL_TABLET | Freq: Two times a day (BID) | ORAL | 0 refills | Status: DC

## 2016-09-16 MED ORDER — METRONIDAZOLE 500 MG PO TABS: 500.0000 mg | ORAL_TABLET | Freq: Three times a day (TID) | ORAL | 0 refills | Status: DC

## 2016-09-16 NOTE — Progress Notes (Signed)
This patient has a known history of diverticulitis she is also had a complicated course after words with inflammation of the lower intestine and some bowel discomfort. She was doing marginally better soon after a second treatment for diverticulitis she had a follow-up CAT scan which did not show any abscess or perforation but did show some thickening of the colon wall possibly due to underdistention with contrast. Since then she is not had any mucousy no bloody stools no high fever chills she was proximally 75% better and was eating a soft diet  This morning she had onset of lower abdominal pain discomfort. More in the lower central portion left side a right side. Feels like aching sensation that intensifies then lets up there is no vomiting with it no bloody stools no fever or chills.  On physical exam lungs are clear hearts regular pulse normal abdomen soft no guarding or rebound there is some tenderness in the lower abdomen region slightly worse on the left than the right side appears to be mainly left middle with some on the right.  Intermittent lower abdominal pain I doubt that this is a flareup of diverticulitis at this point. I did go ahead and print prescriptions for antibiotics if she starts running fever over the weekend or progressive pain she will need to start the medication. If she gets worse she will need to follow-up here or with her gastroenterologist.  I did recommend stool studies for C. difficile PCR await the results of this. If positive this will need to be treated otherwise she will give [place] an update in 7-10 days how she is doing. She is aware to contact gastroenterology of significantly worse in the follow-up with [place] also as well if any problems

## 2016-09-16 NOTE — Telephone Encounter (Signed)
Discussed with pt. Pt states she wants to do stat bw. Pain is bad enough to where she had to leave work. Pt is going straight over to hospital to do stat cbc then will come straight to our office to see dr scott.

## 2016-09-16 NOTE — Telephone Encounter (Signed)
Pt called stating that she was ok this morning and now the pain is starting to increase and that it came on all of a sudden. Pt wants to know what she should do. Please advise.

## 2016-09-16 NOTE — Telephone Encounter (Signed)
Please call the patient if she is having significant discomfort and pain I think the best thing to do is to do stat CBC through the lab ASAP after she gets the blood drawn at the lab to immediately come to the office. If she could do this right away that would be very beneficial to handling this matter today given its late on a Friday

## 2016-09-17 ENCOUNTER — Encounter (INDEPENDENT_AMBULATORY_CARE_PROVIDER_SITE_OTHER): Payer: Self-pay | Admitting: Internal Medicine

## 2016-09-22 LAB — CLOSTRIDIUM DIFFICILE BY PCR: CDIFFPCR: NEGATIVE

## 2016-09-26 ENCOUNTER — Telehealth: Payer: Self-pay | Admitting: Family Medicine

## 2016-09-26 ENCOUNTER — Encounter: Payer: Self-pay | Admitting: Family Medicine

## 2016-09-26 DIAGNOSIS — R5383 Other fatigue: Secondary | ICD-10-CM

## 2016-09-26 DIAGNOSIS — E876 Hypokalemia: Secondary | ICD-10-CM

## 2016-09-26 NOTE — Telephone Encounter (Signed)
This patient needs to repeat her metabolic 7 due to hypokalemia and also do a vitamin D level-patient wanted to get this checked-somewhere within the next couple weeks-please order these printout the order form and mail it to the patient thank you

## 2016-09-27 NOTE — Addendum Note (Signed)
Addended by: Launa Grill on: 09/27/2016 08:24 AM   Modules accepted: Orders

## 2016-09-27 NOTE — Telephone Encounter (Signed)
Labs ordered and sent to be mailed to patient.

## 2016-10-09 ENCOUNTER — Encounter: Payer: Self-pay | Admitting: Family Medicine

## 2016-10-09 DIAGNOSIS — K5792 Diverticulitis of intestine, part unspecified, without perforation or abscess without bleeding: Secondary | ICD-10-CM

## 2016-10-10 NOTE — Addendum Note (Signed)
Addended by: Launa Grill on: 10/10/2016 03:37 PM   Modules accepted: Orders

## 2016-10-10 NOTE — Telephone Encounter (Signed)
Please let the patient know that I reviewed over her message-The likelihood of her having osteoporosis is low at her age. I would not recommend a bone density at this point. I would recommend that you offered to the patient nutritionist referral at the nutritionist Center in [place] for consultation with her regarding the diverticulitis.

## 2016-10-13 ENCOUNTER — Encounter: Payer: Self-pay | Admitting: Family Medicine

## 2016-10-19 LAB — BASIC METABOLIC PANEL
BUN/Creatinine Ratio: 14 (ref 9–23)
BUN: 10 mg/dL (ref 6–24)
CHLORIDE: 95 mmol/L — AB (ref 96–106)
CO2: 28 mmol/L (ref 18–29)
Calcium: 10 mg/dL (ref 8.7–10.2)
Creatinine, Ser: 0.69 mg/dL (ref 0.57–1.00)
GFR calc Af Amer: 113 mL/min/{1.73_m2} (ref >59)
GFR, EST NON AFRICAN AMERICAN: 98 mL/min/{1.73_m2} (ref >59)
Glucose: 71 mg/dL (ref 65–99)
POTASSIUM: 3.7 mmol/L (ref 3.5–5.2)
SODIUM: 139 mmol/L (ref 134–144)

## 2016-10-19 LAB — VITAMIN D 25 HYDROXY (VIT D DEFICIENCY, FRACTURES): Vit D, 25-Hydroxy: 32.7 ng/mL (ref 30.0–100.0)

## 2016-10-25 ENCOUNTER — Encounter: Payer: Self-pay | Admitting: Registered"

## 2016-10-25 ENCOUNTER — Encounter: Payer: 59 | Attending: Family Medicine | Admitting: Registered"

## 2016-10-25 DIAGNOSIS — Z6827 Body mass index (BMI) 27.0-27.9, adult: Secondary | ICD-10-CM | POA: Insufficient documentation

## 2016-10-25 DIAGNOSIS — Z713 Dietary counseling and surveillance: Secondary | ICD-10-CM | POA: Insufficient documentation

## 2016-10-25 DIAGNOSIS — K5792 Diverticulitis of intestine, part unspecified, without perforation or abscess without bleeding: Secondary | ICD-10-CM | POA: Diagnosis not present

## 2016-10-25 DIAGNOSIS — K5732 Diverticulitis of large intestine without perforation or abscess without bleeding: Secondary | ICD-10-CM

## 2016-10-25 NOTE — Patient Instructions (Addendum)
   Start putting oatmeal in diet, if no reaction start adding more things to it, consider adding ground flax seed to oatmeal for omega 3 fatty acid.     Ideas to increase protein: soy products, edamame, hummus. Protein powder may an option, try simple ingredients.    Consider adding more green vegetables to your diet as tolerated.    Wait to add vegetables in which can cause gas such as broccoli, cauliflower, cucumbers.    Magnesium for constipation, take Calm at night follow instructions on bottle.  Consider taking fish oil. Avoid 3-6-9 blends. Brands that are good choices Joline Salt, Nature's Made, and [employer].

## 2016-10-25 NOTE — Progress Notes (Signed)
Medical Nutrition Therapy:  Appt start time: 0815 end time:  0920.   Assessment:  Primary concerns today: Pt states since a diverticulitis event in February she has drastically changed her diet to cut out fiber. Pt states for the last 2 weeks she has been constipated. Pt is interested in ways to safely add fiber back into her diet. Pt also had questions about vitamin need due to diet restrictions.   Prior to having diverticulitis, pt reports for the last 5 years she had been eating a lot of nuts, seeds, fruits and vegetables and was successful in losing ~25 lbs.  Pt states she had believed she had many food allergies until she went to a MD at [facility] who identified her issue as an allergy to aspirin and since then she has been able to eat many foods that previously caused allergic reactions on her skin.   Preferred Learning Style:   No preference indicated   Learning Readiness:   Change in progress   MEDICATIONS: reviewed   DIETARY INTAKE:  Everyday foods include pop tart (safe food).  Avoided foods include red meat, onions and garlic (for about 10 yrs).  Sweet potatoes cause problems up to here  24-hr recall:  B ( AM): black tea with honey AND pop tart OR 1/2 english muffin, 1/4 c blueberries Snk ( AM): pop tart OR part of sandwich  L ( PM): 1/4 cooked pasta, spinach or kale, roasted veggies, poultry (~1.5 oz)  Snk ( PM): fruit, [place], cheese on white bread a little iceberg lettuce D ( PM): green beans, potatoes (tired of them), 1/2 [place] sandwich Snk ( PM): none Beverages: water   Usual physical activity: 20-40 min walking daily  Estimated energy needs: 1800 calories 200 g carbohydrates 135 g protein 50 g fat  Progress Towards Goal(s):  In progress.   Nutritional Diagnosis:  [place]-1.4 Altered GI function As related to some fiberous foods causing diverticulitis.  As evidenced by recent hospitalization for diverticulitis, diet recall and related reactive symptoms.     Intervention:  Nutrition Education. Discussed disease state of diverticulitis/diverticulosis and relation to fiber. Discussed bowel health and fiber and strategies for regularity. Discussed food reintroduction. Discussed food groups and balanced eating strategies.   Plan:  Start putting oatmeal in diet, if no reaction start adding more things to it, consider adding ground flax seed to oatmeal for omega 3 fatty acid.   Ideas to increase protein: soy products, edamame, hummus. Protein powder may an option, try simple ingredients.  Consider adding more green vegetables to your diet as tolerated.  Wait to add vegetables in which can cause gas such as broccoli, cauliflower, cucumbers.  Magnesium for constipation, take Calm at night follow instructions on bottle.  Consider taking fish oil. Avoid 3-6-9 blends. Brands that are good choices Joline Salt, Nature's Made, and [employer].  Teaching Method Utilized:  Auditory Visual  Handouts given during visit include:  vitamins  Barriers to learning/adherence to lifestyle change: none  Demonstrated degree of understanding via:  Teach Back   Monitoring/Evaluation:  Dietary intake, exercise, fiber tolerance, and body weight prn.

## 2016-11-07 ENCOUNTER — Ambulatory Visit: Payer: 59 | Admitting: Registered"

## 2016-11-18 ENCOUNTER — Encounter: Payer: Self-pay | Admitting: Family Medicine

## 2016-11-18 ENCOUNTER — Other Ambulatory Visit: Payer: Self-pay | Admitting: Family Medicine

## 2016-11-18 ENCOUNTER — Encounter (INDEPENDENT_AMBULATORY_CARE_PROVIDER_SITE_OTHER): Payer: Self-pay | Admitting: Internal Medicine

## 2016-11-18 ENCOUNTER — Telehealth: Payer: Self-pay | Admitting: Family Medicine

## 2016-11-18 MED ORDER — METRONIDAZOLE 500 MG PO TABS: 500.0000 mg | ORAL_TABLET | Freq: Three times a day (TID) | ORAL | 0 refills | Status: DC

## 2016-11-18 MED ORDER — CIPROFLOXACIN HCL 500 MG PO TABS: 500.0000 mg | ORAL_TABLET | Freq: Two times a day (BID) | ORAL | 0 refills | Status: DC

## 2016-11-18 NOTE — Telephone Encounter (Signed)
I did discuss this with the patient. Because of her symptomatology in soreness in the lower abdomen area I believe it is reasonable to go forward with being on antibiotics for an additional round the patient will give me an update how she is doing over the course of the next several days. She has a colonoscopy coming up later in [place] was sent in to the pharmacy

## 2016-11-18 NOTE — Telephone Encounter (Signed)
Patient said she has had pain from her diverticulitis since Wednesday.  She immediately went to light foods and went to fluids yesterday, but its not going away.  She wants to know if Dr. Nicki Reaper wants to do a blood count on her to check for infection. She called Dr. Olevia Perches office, but they cannot help her until next week.

## 2016-11-18 NOTE — Telephone Encounter (Signed)
Pain below navel across the front of abdomen. Pt states same area is always is in. No fever, no vomiting, no diarrhea. Pain not bad enough to stop her from doing any activity or for her to have to take any meds for pain. Started noticing it on Wednesday, went to bland diet worse on thurdsday. Going out of town tomorrow. The only thing she has changed in diet is what the dietician recommended one eight of a cup of oatmeal with blueberries and added one magnessium tablet at night.

## 2016-11-19 ENCOUNTER — Encounter: Payer: Self-pay | Admitting: Family Medicine

## 2016-11-22 ENCOUNTER — Encounter (INDEPENDENT_AMBULATORY_CARE_PROVIDER_SITE_OTHER): Payer: Self-pay | Admitting: *Deleted

## 2016-11-22 ENCOUNTER — Telehealth: Payer: Self-pay | Admitting: Family Medicine

## 2016-11-22 ENCOUNTER — Telehealth (INDEPENDENT_AMBULATORY_CARE_PROVIDER_SITE_OTHER): Payer: Self-pay | Admitting: *Deleted

## 2016-11-22 MED ORDER — PEG-KCL-NACL-NASULF-NA ASC-C 100 G PO SOLR: 1.0000 | Freq: Once | ORAL | 0 refills | Status: AC

## 2016-11-22 NOTE — Telephone Encounter (Signed)
Nurse's-please connect with the patient. The prescriptions were sent in on Friday the electronic verification states it was sent to [facility]. As for my chart messages they are not checked on the weekend. Please talk with the patient see if she would like to have the prescriptions sent back in or we can send them with the request that may be put on file-should she have further trouble please let us know

## 2016-11-22 NOTE — Telephone Encounter (Signed)
I discussed the situation with the patient. Apparently the medications were printed but not sent in. Therefore we will send him in this morning. She is having some discomfort. She will go a soft diet she will let us know if ongoing troubles. She will follow-up with gastroenterology.

## 2016-11-22 NOTE — Telephone Encounter (Signed)
Patient needs movi prep 

## 2016-11-22 NOTE — Telephone Encounter (Signed)
TCS has been resch'd to 01/25/17 -- patient wants to know if she needs to see your prio to TCS since she's keeps having flares or is there something she can do to help prevent them -- she states she is watching what she eats -- please advise  Ph# [phone]

## 2016-11-22 NOTE — Telephone Encounter (Signed)
I have spoken with patient 

## 2016-12-05 ENCOUNTER — Telehealth: Payer: Self-pay | Admitting: Family Medicine

## 2016-12-05 MED ORDER — NYSTATIN 100000 UNIT/ML MT SUSP: OROMUCOSAL | 0 refills | Status: DC

## 2016-12-05 NOTE — Telephone Encounter (Signed)
Patient denies fever and states that tongue has a white coating, and sore throat. Has been on antibiotics. Completed course of antibiotics on Friday. Has had some exposure to strep throat about 1 week ago. Please advise?  [place]

## 2016-12-05 NOTE — Telephone Encounter (Signed)
Spoke with patient and informed her per Dr.Scott Luking- Typically with this patient will get better with medication. Nystatin oral solution 1 teaspoon swish and swallow 4 times a day for 7 days typically will take care of this. If this is not showing any signs improvement over the next 2-3 days call back and we will prescribe additional medications such as Diflucan. Patient verbalized understanding.

## 2016-12-05 NOTE — Telephone Encounter (Signed)
Patient just finished her antibiotics for her diverticulitis and believes she has thrush. She says her throat is very sore and her tongue is white.  She is wanting to know if we can call something in?  [place]

## 2016-12-05 NOTE — Telephone Encounter (Signed)
Typically with this patient will get better with medication. Nystatin oral solution 1 teaspoon swish and swallow 4 times a day for 7 days typically will take care of this. If this is not showing any signs improvement over the next 2-3 days call back and we will prescribe additional medications such as Diflucan

## 2016-12-08 ENCOUNTER — Telehealth: Payer: Self-pay | Admitting: Family Medicine

## 2016-12-08 ENCOUNTER — Ambulatory Visit (INDEPENDENT_AMBULATORY_CARE_PROVIDER_SITE_OTHER): Payer: 59 | Admitting: Family Medicine

## 2016-12-08 ENCOUNTER — Encounter: Payer: Self-pay | Admitting: Family Medicine

## 2016-12-08 VITALS — BP 120/80 | Temp 98.2°F | Wt 176.0 lb

## 2016-12-08 DIAGNOSIS — R109 Unspecified abdominal pain: Secondary | ICD-10-CM | POA: Diagnosis not present

## 2016-12-08 DIAGNOSIS — M791 Myalgia, unspecified site: Secondary | ICD-10-CM

## 2016-12-08 DIAGNOSIS — R5383 Other fatigue: Secondary | ICD-10-CM

## 2016-12-08 DIAGNOSIS — R634 Abnormal weight loss: Secondary | ICD-10-CM | POA: Diagnosis not present

## 2016-12-08 DIAGNOSIS — E876 Hypokalemia: Secondary | ICD-10-CM

## 2016-12-08 NOTE — Telephone Encounter (Signed)
Patient still feeling bad, wants to know how long before she starts to feel better? Has had diverticulitis and thrush and doesn't feel like she is getting better fast enough. Please call with advice.

## 2016-12-08 NOTE — Telephone Encounter (Signed)
Spoke with patient and offered her an office visit. Patient verbalized understanding and was transferred to front desk to schedule office visit.

## 2016-12-08 NOTE — Progress Notes (Signed)
   Subjective:    Patient ID: Anita Shea, female    DOB: 1960-09-07, 56 y.o.   MRN: [id number]  HPI  Patient in today with c/o fatigue, muscle aches, and thrush. Patient states she has just felt a general ill feeling. Had a Diverticulitis attack 3 weeks ago. Abdominal pain has resolved.  This patient relates that she's been having a fair amount of trouble with intermittent abdominal pains and discomfort aching in the lower abdomen she denies high fever chills sweats she denies bloody stools or mucousy stools she does have to take medication ordered to have a bowel movement she has had a repetitive bouts of diverticulitis and is been on antibiotics several different times she has a colonoscopy coming up later this summer she denies any other particular troubles currently  Review of Systems  Constitutional: Positive for fatigue. Negative for fever.  HENT: Negative for congestion.   Respiratory: Negative for cough.   Cardiovascular: Negative for chest pain.  Gastrointestinal: Positive for abdominal pain and constipation.       Objective:   Physical Exam  Constitutional: She appears well-nourished. No distress.  HENT:  Head: Normocephalic.  Cardiovascular: Normal rate, regular rhythm and normal heart sounds.   No murmur heard. Pulmonary/Chest: Effort normal and breath sounds normal.  Abdominal: Soft. There is tenderness.  Musculoskeletal: She exhibits no edema.  Lymphadenopathy:    She has no cervical adenopathy.  Neurological: She is alert.  Psychiatric: Her behavior is normal.  Vitals reviewed.   She does relate some muscle aches and discomfort but denies any swelling in the joints denies rashes denies any tick bites recently      Assessment & Plan:  Abdominal discomfort-I have hard time believing that all of her abdominal discomfort is related to repetitive diverticulitis. I wonder if there may be some level of colitis going on or potentially IBS related symptoms. I believe  this patient would be best served from additional lab work. Certainly getting a colonoscopy will be helpful. Plus also a sitdown meeting with the gastroenterologist after the colonoscopy in order to discuss how she should approach this reoccurring abdominal pain.

## 2016-12-09 LAB — CBC WITH DIFFERENTIAL/PLATELET
Basophils Absolute: 0 10*3/uL (ref 0.0–0.2)
Basos: 0 %
EOS (ABSOLUTE): 0.2 10*3/uL (ref 0.0–0.4)
EOS: 2 %
HEMATOCRIT: 43.7 % (ref 34.0–46.6)
Hemoglobin: 14.7 g/dL (ref 11.1–15.9)
Immature Grans (Abs): 0 10*3/uL (ref 0.0–0.1)
Immature Granulocytes: 0 %
LYMPHS ABS: 2.8 10*3/uL (ref 0.7–3.1)
Lymphs: 38 %
MCH: 30.2 pg (ref 26.6–33.0)
MCHC: 33.6 g/dL (ref 31.5–35.7)
MCV: 90 fL (ref 79–97)
MONOS ABS: 0.4 10*3/uL (ref 0.1–0.9)
Monocytes: 5 %
NEUTROS ABS: 4 10*3/uL (ref 1.4–7.0)
Neutrophils: 55 %
Platelets: 321 10*3/uL (ref 150–379)
RBC: 4.87 x10E6/uL (ref 3.77–5.28)
RDW: 13.9 % (ref 12.3–15.4)
WBC: 7.4 10*3/uL (ref 3.4–10.8)

## 2016-12-09 LAB — POTASSIUM: Potassium: 3.6 mmol/L (ref 3.5–5.2)

## 2016-12-09 LAB — HEPATIC FUNCTION PANEL
ALK PHOS: 65 IU/L (ref 39–117)
ALT: 18 IU/L (ref 0–32)
AST: 20 IU/L (ref 0–40)
Albumin: 4.9 g/dL (ref 3.5–5.5)
BILIRUBIN, DIRECT: 0.09 mg/dL (ref 0.00–0.40)
Bilirubin Total: 0.3 mg/dL (ref 0.0–1.2)
TOTAL PROTEIN: 7.8 g/dL (ref 6.0–8.5)

## 2016-12-09 LAB — TISSUE TRANSGLUTAMINASE, IGA: Transglutaminase IgA: <2 U/mL (ref 0–3)

## 2016-12-09 LAB — VITAMIN B12: Vitamin B-12: 515 pg/mL (ref 232–1245)

## 2016-12-09 LAB — CK: CK TOTAL: 68 U/L (ref 24–173)

## 2016-12-09 LAB — SEDIMENTATION RATE: Sed Rate: 4 mm/hr (ref 0–40)

## 2016-12-09 LAB — PREALBUMIN: PREALBUMIN: 25 mg/dL (ref 10–36)

## 2016-12-12 ENCOUNTER — Encounter: Payer: Self-pay | Admitting: Family Medicine

## 2016-12-21 ENCOUNTER — Other Ambulatory Visit: Payer: Self-pay | Admitting: Family Medicine

## 2017-01-09 ENCOUNTER — Encounter (INDEPENDENT_AMBULATORY_CARE_PROVIDER_SITE_OTHER): Payer: Self-pay | Admitting: *Deleted

## 2017-01-09 ENCOUNTER — Telehealth (INDEPENDENT_AMBULATORY_CARE_PROVIDER_SITE_OTHER): Payer: Self-pay | Admitting: *Deleted

## 2017-01-09 MED ORDER — PEG-KCL-NACL-NASULF-NA ASC-C 100 G PO SOLR: 1.0000 | Freq: Once | ORAL | 0 refills | Status: AC

## 2017-01-09 NOTE — Telephone Encounter (Signed)
Patient needs movi prep 

## 2017-01-10 ENCOUNTER — Encounter (INDEPENDENT_AMBULATORY_CARE_PROVIDER_SITE_OTHER): Payer: Self-pay | Admitting: *Deleted

## 2017-01-31 ENCOUNTER — Ambulatory Visit (INDEPENDENT_AMBULATORY_CARE_PROVIDER_SITE_OTHER): Payer: BLUE CROSS/BLUE SHIELD | Admitting: Family Medicine

## 2017-01-31 ENCOUNTER — Encounter: Payer: Self-pay | Admitting: Family Medicine

## 2017-01-31 VITALS — BP 122/74 | Ht 67.5 in | Wt 182.2 lb

## 2017-01-31 DIAGNOSIS — Z111 Encounter for screening for respiratory tuberculosis: Secondary | ICD-10-CM | POA: Diagnosis not present

## 2017-01-31 DIAGNOSIS — I10 Essential (primary) hypertension: Secondary | ICD-10-CM

## 2017-01-31 MED ORDER — HYDROCHLOROTHIAZIDE 25 MG PO TABS: 25.0000 mg | ORAL_TABLET | Freq: Every day | ORAL | 2 refills | Status: DC

## 2017-01-31 MED ORDER — POTASSIUM CHLORIDE ER 10 MEQ PO TBCR: EXTENDED_RELEASE_TABLET | ORAL | 2 refills | Status: DC

## 2017-01-31 NOTE — Progress Notes (Signed)
   Subjective:    Patient ID: Anita Shea, female    DOB: [birth year], 57 y.o.   MRN: [id number]  Hypertension  This is a chronic problem. The current episode started more than 1 year ago. Risk factors for coronary artery disease include post-menopausal state. Treatments tried: hctz. There are no compliance problems.    Patient denies any chest tightness pressure pain shortness breath nausea vomiting diarrhea she does get a lot of bloating with her readings she is recently getting over an intestinal ailment she has colonoscopy coming up in September   Review of Systems No fever no diarrhea   please see above denies chest pain shortness of breath Objective:   Physical Exam Lungs clear heart regular  Blood pressure good     Assessment & Plan:  HTN good control Renew medications Potassium 2 in the morning one yet knee Continue HCTZ Get colonoscopy in September as planned Patient improved for teaching

## 2017-02-02 ENCOUNTER — Other Ambulatory Visit: Payer: Self-pay | Admitting: *Deleted

## 2017-02-02 DIAGNOSIS — I10 Essential (primary) hypertension: Secondary | ICD-10-CM

## 2017-02-02 LAB — TB SKIN TEST
INDURATION: 0 mm
TB SKIN TEST: NEGATIVE

## 2017-02-02 NOTE — Addendum Note (Signed)
Addended by: Carmelina Noun on: 02/02/2017 09:58 AM   Modules accepted: Orders

## 2017-03-08 ENCOUNTER — Encounter (HOSPITAL_COMMUNITY)
Admission: RE | Disposition: A | Discharge status: Home / Self Care | Payer: Self-pay | Source: Ambulatory Visit | Attending: Internal Medicine

## 2017-03-08 ENCOUNTER — Encounter: Payer: Self-pay | Admitting: Family Medicine

## 2017-03-08 ENCOUNTER — Encounter (HOSPITAL_COMMUNITY): Payer: Self-pay | Admitting: *Deleted

## 2017-03-08 ENCOUNTER — Ambulatory Visit (HOSPITAL_COMMUNITY)
Admission: RE | Admit: 2017-03-08 | Discharge: 2017-03-08 | Disposition: A | Discharge status: Home / Self Care | Payer: BLUE CROSS/BLUE SHIELD | Source: Ambulatory Visit | Attending: Internal Medicine | Admitting: Internal Medicine

## 2017-03-08 DIAGNOSIS — Z886 Allergy status to analgesic agent status: Secondary | ICD-10-CM | POA: Insufficient documentation

## 2017-03-08 DIAGNOSIS — Z833 Family history of diabetes mellitus: Secondary | ICD-10-CM | POA: Diagnosis not present

## 2017-03-08 DIAGNOSIS — Z9049 Acquired absence of other specified parts of digestive tract: Secondary | ICD-10-CM | POA: Diagnosis not present

## 2017-03-08 DIAGNOSIS — D122 Benign neoplasm of ascending colon: Secondary | ICD-10-CM | POA: Insufficient documentation

## 2017-03-08 DIAGNOSIS — I10 Essential (primary) hypertension: Secondary | ICD-10-CM | POA: Diagnosis not present

## 2017-03-08 DIAGNOSIS — K5730 Diverticulosis of large intestine without perforation or abscess without bleeding: Secondary | ICD-10-CM | POA: Diagnosis not present

## 2017-03-08 DIAGNOSIS — Z8249 Family history of ischemic heart disease and other diseases of the circulatory system: Secondary | ICD-10-CM | POA: Diagnosis not present

## 2017-03-08 DIAGNOSIS — Z823 Family history of stroke: Secondary | ICD-10-CM | POA: Diagnosis not present

## 2017-03-08 DIAGNOSIS — Z888 Allergy status to other drugs, medicaments and biological substances status: Secondary | ICD-10-CM | POA: Diagnosis not present

## 2017-03-08 DIAGNOSIS — K5732 Diverticulitis of large intestine without perforation or abscess without bleeding: Secondary | ICD-10-CM | POA: Diagnosis not present

## 2017-03-08 DIAGNOSIS — Z79899 Other long term (current) drug therapy: Secondary | ICD-10-CM | POA: Insufficient documentation

## 2017-03-08 HISTORY — PX: POLYPECTOMY: SHX5525

## 2017-03-08 HISTORY — PX: COLONOSCOPY: SHX5424

## 2017-03-08 HISTORY — DX: Adverse effect of unspecified anesthetic, initial encounter: T41.45XA

## 2017-03-08 HISTORY — DX: Other complications of anesthesia, initial encounter: T88.59XA

## 2017-03-08 SURGERY — COLONOSCOPY
Anesthesia: Moderate Sedation

## 2017-03-08 MED ORDER — ONDANSETRON HCL 4 MG/2ML IJ SOLN
INTRAMUSCULAR | Status: AC
  Filled 2017-03-08: qty 2

## 2017-03-08 MED ORDER — DOCUSATE SODIUM 100 MG PO CAPS: 200.0000 mg | ORAL_CAPSULE | Freq: Every day | ORAL | 0 refills | Status: AC

## 2017-03-08 MED ORDER — ONDANSETRON HCL 4 MG/2ML IJ SOLN
INTRAMUSCULAR | Status: DC | PRN
  Administered 2017-03-08: 4 mg via INTRAVENOUS

## 2017-03-08 MED ORDER — MEPERIDINE HCL 50 MG/ML IJ SOLN
INTRAMUSCULAR | Status: DC | PRN
  Administered 2017-03-08 (×2): 25 mg via INTRAVENOUS

## 2017-03-08 MED ORDER — SODIUM CHLORIDE 0.9 % IV SOLN
INTRAVENOUS | Status: DC
  Administered 2017-03-08: 1000 mL via INTRAVENOUS

## 2017-03-08 MED ORDER — SIMETHICONE 40 MG/0.6ML PO SUSP
ORAL | Status: DC | PRN
  Administered 2017-03-08: 2.5 mL

## 2017-03-08 MED ORDER — MEPERIDINE HCL 50 MG/ML IJ SOLN
INTRAMUSCULAR | Status: AC
  Filled 2017-03-08: qty 1

## 2017-03-08 MED ORDER — MIDAZOLAM HCL 5 MG/5ML IJ SOLN
INTRAMUSCULAR | Status: DC | PRN
  Administered 2017-03-08 (×6): 2 mg via INTRAVENOUS

## 2017-03-08 MED ORDER — MIDAZOLAM HCL 5 MG/5ML IJ SOLN
INTRAMUSCULAR | Status: AC
  Filled 2017-03-08: qty 5

## 2017-03-08 MED ORDER — MIDAZOLAM HCL 5 MG/5ML IJ SOLN
INTRAMUSCULAR | Status: AC
  Filled 2017-03-08: qty 10

## 2017-03-08 NOTE — Discharge Instructions (Addendum)
High-Fiber Diet Fiber, also called dietary fiber, is a type of carbohydrate found in fruits, vegetables, whole grains, and beans. A high-fiber diet can have many health benefits. Your health care provider may recommend a high-fiber diet to help:  Prevent constipation. Fiber can make your bowel movements more regular.  Lower your cholesterol.  Relieve hemorrhoids, uncomplicated diverticulosis, or irritable bowel syndrome.  Prevent overeating as part of a weight-loss plan.  Prevent heart disease, type 2 diabetes, and certain cancers.  What is my plan? The recommended daily intake of fiber includes:  38 grams for men under age 85.  34 grams for men over age 32.  72 grams for women under age 41.  39 grams for women over age 37.  You can get the recommended daily intake of dietary fiber by eating a variety of fruits, vegetables, grains, and beans. Your health care provider may also recommend a fiber supplement if it is not possible to get enough fiber through your diet. What do I need to know about a high-fiber diet?  Fiber supplements have not been widely studied for their effectiveness, so it is better to get fiber through food sources.  Always check the fiber content on thenutrition facts label of any prepackaged food. Look for foods that contain at least 5 grams of fiber per serving.  Ask your dietitian if you have questions about specific foods that are related to your condition, especially if those foods are not listed in the following section.  Increase your daily fiber consumption gradually. Increasing your intake of dietary fiber too quickly may cause bloating, cramping, or gas.  Drink plenty of water. Water helps you to digest fiber. What foods can I eat? Grains Whole-grain breads. Multigrain cereal. Oats and oatmeal. Brown rice. Barley. Bulgur wheat. [place]. Bran muffins. Popcorn. Rye wafer crackers. Vegetables Sweet potatoes. Spinach. Kale. Artichokes. Cabbage.  Broccoli. Green peas. Carrots. Squash. Fruits Berries. Pears. Apples. Oranges. Avocados. Prunes and raisins. Dried figs. Meats and Other Protein Sources [place], kidney, pinto, and soy beans. Split peas. Lentils. Nuts and seeds. Dairy Fiber-fortified yogurt. Beverages Fiber-fortified soy milk. Fiber-fortified orange juice. Other Fiber bars. The items listed above may not be a complete list of recommended foods or beverages. Contact your dietitian for more options. What foods are not recommended? Grains White bread. Pasta made with refined flour. White rice. Vegetables Fried potatoes. Canned vegetables. Well-cooked vegetables. Fruits Fruit juice. Cooked, strained fruit. Meats and Other Protein Sources Fatty cuts of meat. Fried Sales executive or fried fish. Dairy Milk. Yogurt. Cream cheese. Sour cream. Beverages Soft drinks. Other Cakes and pastries. Butter and oils. The items listed above may not be a complete list of foods and beverages to avoid. Contact your dietitian for more information. What are some tips for including high-fiber foods in my diet?  Eat a wide variety of high-fiber foods.  Make sure that half of all grains consumed each day are whole grains.  Replace breads and cereals made from refined flour or white flour with whole-grain breads and cereals.  Replace white rice with brown rice, bulgur wheat, or millet.  Start the day with a breakfast that is high in fiber, such as a cereal that contains at least 5 grams of fiber per serving.  Use beans in place of meat in soups, salads, or pasta.  Eat high-fiber snacks, such as berries, raw vegetables, nuts, or popcorn. This information is not intended to replace advice given to you by your health care provider. Make sure you discuss  any questions you have with your health care provider. Document Released: 06/13/2005 Document Revised: 11/19/2015 Document Reviewed: 11/26/2013 Elsevier Interactive Patient Education  2017  [place]. Colonoscopy, Adult, Care After This sheet gives you information about how to care for yourself after your procedure. Your health care provider may also give you more specific instructions. If you have problems or questions, contact your health care provider. What can I expect after the procedure? After the procedure, it is common to have:  A small amount of blood in your stool for 24 hours after the procedure.  Some gas.  Mild abdominal cramping or bloating.  Follow these instructions at home: General instructions   For the first 24 hours after the procedure: ? Do not drive or use machinery. ? Do not sign important documents. ? Do not drink alcohol. ? Do your regular daily activities at a slower pace than normal. ? Eat soft, easy-to-digest foods. ? Rest often.  Take over-the-counter or prescription medicines only as told by your health care provider.  It is up to you to get the results of your procedure. Ask your health care provider, or the department performing the procedure, when your results will be ready. Relieving cramping and bloating  Try walking around when you have cramps or feel bloated.  Apply heat to your abdomen as told by your health care provider. Use a heat source that your health care provider recommends, such as a moist heat pack or a heating pad. ? Place a towel between your skin and the heat source. ? Leave the heat on for 20-30 minutes. ? Remove the heat if your skin turns bright red. This is especially important if you are unable to feel pain, heat, or cold. You may have a greater risk of getting burned. Eating and drinking  Drink enough fluid to keep your urine clear or pale yellow.  Resume your normal diet as instructed by your health care provider. Avoid heavy or fried foods that are hard to digest.  Avoid drinking alcohol for as long as instructed by your health care provider. Contact a health care provider if:  You have blood in  your stool 2-3 days after the procedure. Get help right away if:  You have more than a small spotting of blood in your stool.  You pass large blood clots in your stool.  Your abdomen is swollen.  You have nausea or vomiting.  You have a fever.  You have increasing abdominal pain that is not relieved with medicine. This information is not intended to replace advice given to you by your health care provider. Make sure you discuss any questions you have with your health care provider. Document Released: 01/26/2004 Document Revised: 03/07/2016 Document Reviewed: 08/25/2015 Elsevier Interactive Patient Education  2018 [employer]. Diverticulosis Diverticulosis is a condition that develops when small pouches (diverticula) form in the wall of the large intestine (colon). The colon is where water is absorbed and stool is formed. The pouches form when the inside layer of the colon pushes through weak spots in the outer layers of the colon. You may have a few pouches or many of them. What are the causes? The cause of this condition is not known. What increases the risk? The following factors may make you more likely to develop this condition:  Being older than age 54. Your risk for this condition increases with age. Diverticulosis is rare among people younger than age 51. By age 40, many people have it.  Eating a  low-fiber diet.  Having frequent constipation.  Being overweight.  Not getting enough exercise.  Smoking.  Taking over-the-counter pain medicines, like aspirin and ibuprofen.  Having a family history of diverticulosis.  What are the signs or symptoms? In most people, there are no symptoms of this condition. If you do have symptoms, they may include:  Bloating.  Cramps in the abdomen.  Constipation or diarrhea.  Pain in the lower left side of the abdomen.  How is this diagnosed? This condition is most often diagnosed during an exam for other colon problems. Because  diverticulosis usually has no symptoms, it often cannot be diagnosed independently. This condition may be diagnosed by:  Using a flexible scope to examine the colon (colonoscopy).  Taking an X-ray of the colon after dye has been put into the colon (barium enema).  Doing a CT scan.  How is this treated? You may not need treatment for this condition if you have never developed an infection related to diverticulosis. If you have had an infection before, treatment may include:  Eating a high-fiber diet. This may include eating more fruits, vegetables, and grains.  Taking a fiber supplement.  Taking a live bacteria supplement (probiotic).  Taking medicine to relax your colon.  Taking antibiotic medicines.  Follow these instructions at home:  Drink 6-8 glasses of water or more each day to prevent constipation.  Try not to strain when you have a bowel movement.  If you have had an infection before: ? Eat more fiber as directed by your health care provider or your diet and nutrition specialist (dietitian). ? Take a fiber supplement or probiotic, if your health care provider approves.  Take over-the-counter and prescription medicines only as told by your health care provider.  If you were prescribed an antibiotic, take it as told by your health care provider. Do not stop taking the antibiotic even if you start to feel better.  Keep all follow-up visits as told by your health care provider. This is important. Contact a health care provider if:  You have pain in your abdomen.  You have bloating.  You have cramps.  You have not had a bowel movement in 3 days. Get help right away if:  Your pain gets worse.  Your bloating becomes very bad.  You have a fever or chills, and your symptoms suddenly get worse.  You vomit.  You have bowel movements that are bloody or black.  You have bleeding from your rectum. Summary  Diverticulosis is a condition that develops when small  pouches (diverticula) form in the wall of the large intestine (colon).  You may have a few pouches or many of them.  This condition is most often diagnosed during an exam for other colon problems.  If you have had an infection related to diverticulosis, treatment may include increasing the fiber in your diet, taking supplements, or taking medicines. This information is not intended to replace advice given to you by your health care provider. Make sure you discuss any questions you have with your health care provider. Document Released: 03/10/2004 Document Revised: 05/02/2016 Document Reviewed: 05/02/2016 Elsevier Interactive Patient Education  2017 [place] usual medications. High fiber diet. Colace (OTC) 200 mg by mouth daily at bedtime. Decrease magnesium to 2 tablets once daily. No driving for 24 hours. Physician will call will call with biopsy results.

## 2017-03-08 NOTE — Op Note (Signed)
[facility] Patient Name: Anita Shea Procedure Date: 03/08/2017 1:34 PM MRN: [id number] Date of Birth: April 26, 1961 Attending MD: Hildred Laser , MD CSN: [id number] Age: 56 Admit Type: Outpatient Procedure:                Colonoscopy Indications:              Follow-up of diverticulitis Providers:                Hildred Laser, MD, Lurline Del, RN, Bonnetta Barry,                            Technician Referring MD:             Elayne Snare Wolfgang Phoenix, MD Medicines:                Meperidine 50 mg IV, Midazolam 12 mg IV,                            Ondansetron 4 mg IV Complications:            No immediate complications. Estimated Blood Loss:     Estimated blood loss was minimal. Procedure:                Pre-Anesthesia Assessment:                           - Prior to the procedure, a History and Physical                            was performed, and patient medications and                            allergies were reviewed. The patient's tolerance of                            previous anesthesia was also reviewed. The risks                            and benefits of the procedure and the sedation                            options and risks were discussed with the patient.                            All questions were answered, and informed consent                            was obtained. Prior Anticoagulants: The patient has                            taken no previous anticoagulant or antiplatelet                            agents. ASA Grade Assessment: I - A normal, healthy  patient. After reviewing the risks and benefits,                            the patient was deemed in satisfactory condition to                            undergo the procedure.                           After obtaining informed consent, the colonoscope                            was passed under direct vision. Throughout the                            procedure, the patient's blood  pressure, pulse, and                            oxygen saturations were monitored continuously. The                            [id number] ([id number]) scope was introduced through                            the anus and advanced to the the terminal ileum,                            with identification of the appendiceal orifice and                            IC valve. The colonoscopy was technically difficult                            and complex due to restricted mobility of sigmoid                            colon. Successful completion of the procedure was                            aided by changing the patient's position, using                            manual pressure and withdrawing the scope and                            replacing with the 'babyscope'. The patient                            tolerated the procedure well. The quality of the                            bowel preparation was adequate. The terminal ileum,  ileocecal valve, appendiceal orifice, and rectum                            were photographed. Scope In: 1:53:14 PM Scope Out: 2:31:13 PM Scope Withdrawal Time: 0 hours 8 minutes 19 seconds  Total Procedure Duration: 0 hours 37 minutes 59 seconds  Findings:      The perianal and digital rectal examinations were normal.      The terminal ileum appeared normal.      A small polyp was found in the ascending colon. The polyp was sessile.       Biopsies were taken with a cold forceps for histology.      Scattered medium-mouthed diverticula were found in the sigmoid colon.      The retroflexed view of the distal rectum and anal verge was normal and       showed no anal or rectal abnormalities. Impression:               - The examined portion of the ileum was normal.                           - One small polyp in the ascending colon. Biopsied.                           - Diverticulosis in the sigmoid colon. Moderate Sedation:      Moderate  (conscious) sedation was administered by the endoscopy nurse       and supervised by the endoscopist. The following parameters were       monitored: oxygen saturation, heart rate, blood pressure, CO2       capnography and response to care. Total physician intraservice time was       48 minutes. Recommendation:           - Patient has a contact number available for                            emergencies. The signs and symptoms of potential                            delayed complications were discussed with the                            patient. Return to normal activities tomorrow.                            Written discharge instructions were provided to the                            patient.                           - High fiber diet today.                           - Continue present medications.                           - Await pathology results.                           -  Repeat colonoscopy date to be determined after                            pending pathology results are reviewed. Procedure Code(s):        --- Professional ---                           [phone], Colonoscopy, flexible; with biopsy, single                            or multiple                           99152, Moderate sedation services provided by the                            same physician or other qualified health care                            professional performing the diagnostic or                            therapeutic service that the sedation supports,                            requiring the presence of an independent trained                            observer to assist in the monitoring of the                            patient's level of consciousness and physiological                            status; initial 15 minutes of intraservice time,                            patient age 71 years or older                           [phone], Moderate sedation services; each additional                             15 minutes intraservice time                           99153, Moderate sedation services; each additional                            15 minutes intraservice time Diagnosis Code(s):        --- Professional ---                           D12.2, Benign neoplasm of ascending colon  K57.32, Diverticulitis of large intestine without                            perforation or abscess without bleeding                           K57.30, Diverticulosis of large intestine without                            perforation or abscess without bleeding CPT copyright 2016 American Medical Association. All rights reserved. The codes documented in this report are preliminary and upon coder review may  be revised to meet current compliance requirements. Hildred Laser, MD Hildred Laser, MD 03/08/2017 2:41:43 PM This report has been signed electronically. Number of Addenda: 0

## 2017-03-08 NOTE — H&P (Signed)
Anita Shea is an 56 y.o. female.   Chief Complaint: Patient is here for colonoscopy. HPI: Patient is 56 year old Caucasian female who was initially treated for episode of sigmoid diverticulitis back in February this year. It took her several weeks to recover. She had another episode in May 2018. She is presently free of symptoms. She is on low-dose to die. She denies rectal bleeding or diarrhea. Family history is negative for CRC.  Past Medical History:  Diagnosis Date  . Complication of anesthesia   . Diverticulitis of colon 08/30/2016  . Hypertension     Past Surgical History:  Procedure Laterality Date  . CHOLECYSTECTOMY     1993, flares  . WISDOM TOOTH EXTRACTION      Family History  Problem Relation Age of Onset  . Stroke Mother   . Heart disease Father   . Heart attack Father   . Diabetes Brother    Social History:  reports that she has never smoked. She has never used smokeless tobacco. She reports that she does not drink alcohol or use drugs.  Allergies:  Allergies  Allergen Reactions  . Aspirin Anaphylaxis  . Lisinopril Cough  . Nsaids Hives    Medications Prior to Admission  Medication Sig Dispense Refill  . Calcium Citrate-Vitamin D (CITRACAL + D PO) Take 1-2 tablets by mouth 2 (two) times daily. Takes 2 in the AM then 1 in the PM    . cetirizine (ZYRTEC) 10 MG tablet Take 10 mg by mouth daily.    . fluticasone (FLONASE) 50 MCG/ACT nasal spray Place 1 spray into both nostrils daily.     . hydrochlorothiazide (HYDRODIURIL) 25 MG tablet Take 1 tablet (25 mg total) by mouth daily. 90 tablet 2  . MAGNESIUM CHLORIDE PO Take 2 tablets by mouth 2 (two) times daily.     . Multiple Vitamin (MULTIVITAMIN) tablet Take 1 tablet by mouth daily.    Marland Kitchen omega-3 fish oil (MAXEPA) 1000 MG CAPS capsule Take 2 capsules by mouth every morning.    . ondansetron (ZOFRAN-ODT) 4 MG disintegrating tablet Take 4 mg by mouth every 8 (eight) hours as needed for nausea or vomiting.    .  potassium chloride (K-DUR) 10 MEQ tablet 2 qam one qafternoon (Patient taking differently: Take 10-20 mEq by mouth 3 (three) times daily. Takes 2 in the AM, 1 at lunch, and 1 in the PM) 270 tablet 2  . Probiotic Product (PROBIOTIC DAILY PO) Take 1 capsule by mouth daily. culturelle 20,000 billion one daily     . ranitidine (ZANTAC) 150 MG tablet Take 300 mg by mouth at bedtime.    . simethicone (MYLICON) 938 MG chewable tablet Chew 250 mg by mouth at bedtime.      No results found for this or any previous visit (from the past 48 hour(s)). No results found.  ROS  Blood pressure 129/75, pulse 81, temperature 98.6 F (37 C), temperature source Oral, resp. rate 18, height 5' 7.5" (1.715 m), weight 175 lb (79.4 kg), last menstrual period 09/18/2012, SpO2 (!) 77 %. Physical Exam  Constitutional: She appears well-developed and well-nourished.  HENT:  Mouth/Throat: Oropharynx is clear and moist.  Eyes: Conjunctivae are normal. No scleral icterus.  Neck: No thyromegaly present.  Cardiovascular: Normal rate, regular rhythm and normal heart sounds.   No murmur heard. Respiratory: Effort normal and breath sounds normal.  GI: Soft. She exhibits no distension and no mass. There is no tenderness.  Musculoskeletal: She exhibits no edema.  Lymphadenopathy:  She has no cervical adenopathy.  Neurological: She is alert.  Skin: Skin is warm and dry.     Assessment/Plan History of sigmoid colon diverticulitis. Diagnostic colonoscopy.  Hildred Laser, MD 03/08/2017, 1:39 PM

## 2017-03-12 ENCOUNTER — Encounter: Payer: Self-pay | Admitting: Family Medicine

## 2017-03-12 ENCOUNTER — Encounter (INDEPENDENT_AMBULATORY_CARE_PROVIDER_SITE_OTHER): Payer: Self-pay | Admitting: Internal Medicine

## 2017-03-13 ENCOUNTER — Encounter (HOSPITAL_COMMUNITY): Payer: Self-pay | Admitting: Internal Medicine

## 2017-08-31 ENCOUNTER — Ambulatory Visit: Payer: BLUE CROSS/BLUE SHIELD | Admitting: Family Medicine

## 2017-08-31 ENCOUNTER — Encounter: Payer: Self-pay | Admitting: Family Medicine

## 2017-08-31 VITALS — BP 138/86 | Temp 98.9°F | Ht 67.5 in | Wt 189.0 lb

## 2017-08-31 DIAGNOSIS — I10 Essential (primary) hypertension: Secondary | ICD-10-CM

## 2017-08-31 DIAGNOSIS — E7849 Other hyperlipidemia: Secondary | ICD-10-CM | POA: Diagnosis not present

## 2017-08-31 DIAGNOSIS — M7711 Lateral epicondylitis, right elbow: Secondary | ICD-10-CM | POA: Diagnosis not present

## 2017-08-31 MED ORDER — PREDNISONE 20 MG PO TABS: ORAL_TABLET | ORAL | 0 refills | Status: DC

## 2017-08-31 NOTE — Progress Notes (Signed)
   Subjective:    Patient ID: Anita Shea, female    DOB: [birth year], 57 y.o.   MRN: [id number]  HPI Patient is here today with complaints of right arm pain. Patient states she is moving and thinks she has over used the right arm.She states she can not brush her hair or teeth with that arm.She states it aches all the time,she states she noticed the pain around six days ago. She is taking Tylenol and icing it and it is no better.  Patient also has a blood pressure issues it has been borderline she is trying to get back into the habit of walking Review of Systems  Constitutional: Negative for activity change, fatigue and fever.  HENT: Negative for congestion.   Respiratory: Negative for cough, chest tightness and shortness of breath.   Cardiovascular: Negative for chest pain and leg swelling.  Gastrointestinal: Negative for abdominal pain.  Skin: Negative for color change.  Neurological: Negative for headaches.  Psychiatric/Behavioral: Negative for behavioral problems.       Objective:   Physical Exam  Constitutional: She appears well-developed and well-nourished. No distress.  HENT:  Head: Normocephalic and atraumatic.  Eyes: Right eye exhibits no discharge. Left eye exhibits no discharge.  Neck: No tracheal deviation present.  Cardiovascular: Normal rate, regular rhythm and normal heart sounds.  No murmur heard. Pulmonary/Chest: Effort normal and breath sounds normal. No respiratory distress. She has no wheezes. She has no rales.  Musculoskeletal: She exhibits no edema.  Lymphadenopathy:    She has no cervical adenopathy.  Neurological: She is alert. She exhibits normal muscle tone.  Skin: Skin is warm and dry. No erythema.  Psychiatric: Her behavior is normal.  Vitals reviewed.   Lungs clear heart regular pulse normal BP good Shoulder range motion good elbow moderate tenderness lateral epicondylitis hand wrist normal no cellulitis noted     Assessment & Plan:    Borderline blood pressure Continue medication Labs ordered Follow-up later in the summer  Lateral epicondylitis patient does not want an injection and not take NSAIDs therefore prednisone taper Tylenol for pain ice stretches follow-up if issues

## 2017-09-23 ENCOUNTER — Other Ambulatory Visit: Payer: Self-pay | Admitting: Family Medicine

## 2017-10-04 ENCOUNTER — Other Ambulatory Visit: Payer: Self-pay | Admitting: Family Medicine

## 2017-11-08 ENCOUNTER — Other Ambulatory Visit: Payer: Self-pay | Admitting: Family Medicine

## 2018-03-11 IMAGING — MG MM DIGITAL SCREENING BILAT
4 series · 4 of 4 positions shown · non-contrast
Comparison: Previous exam(s).

CLINICAL DATA: Screening.

EXAM:
DIGITAL SCREENING BILATERAL MAMMOGRAM WITH CAD

[L MLO]
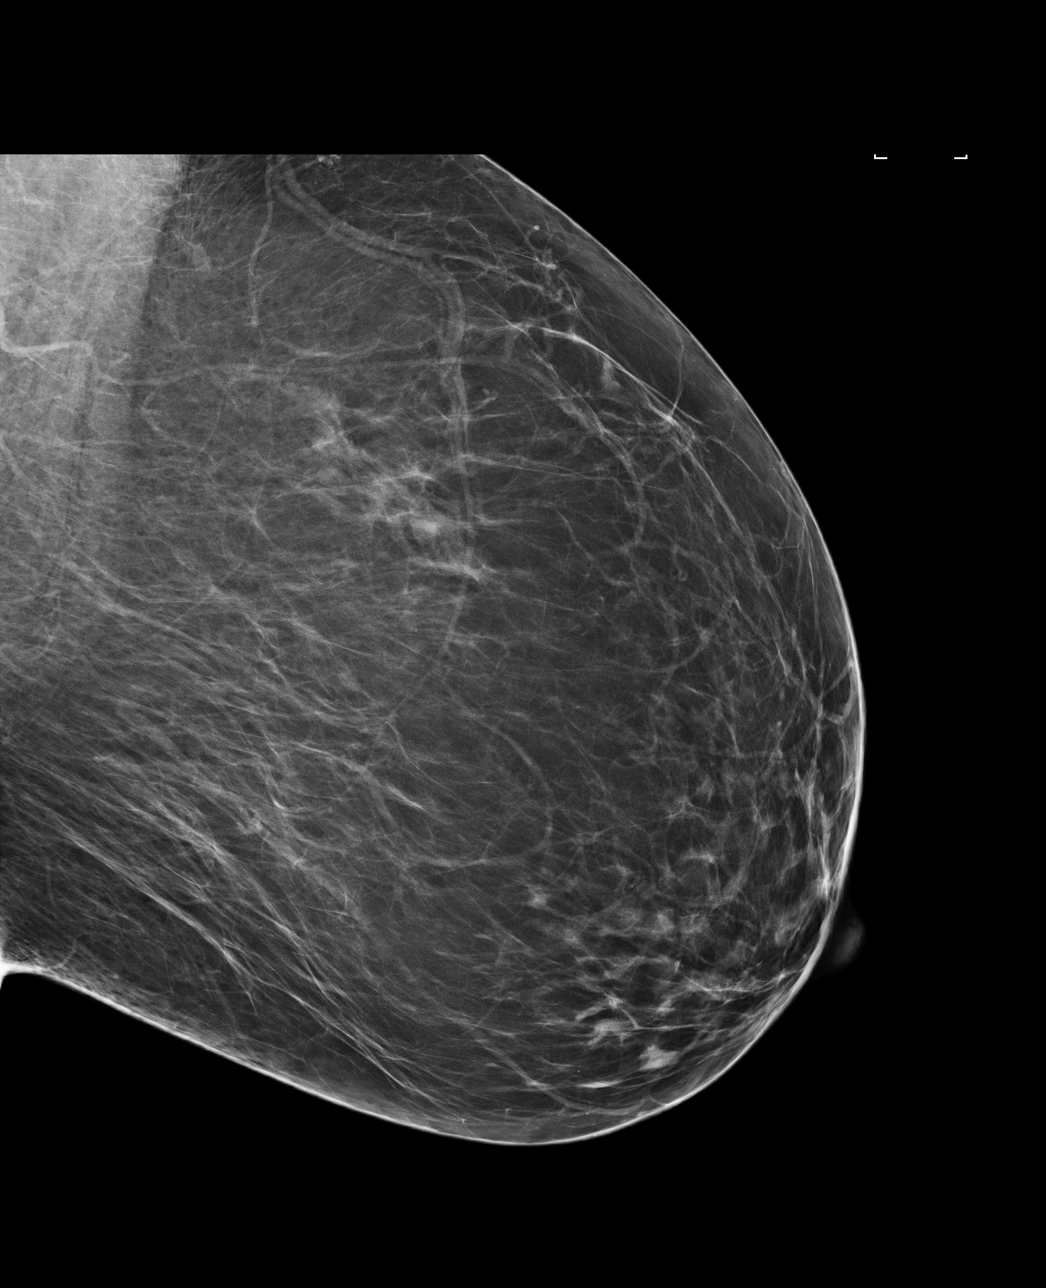

[R MLO]
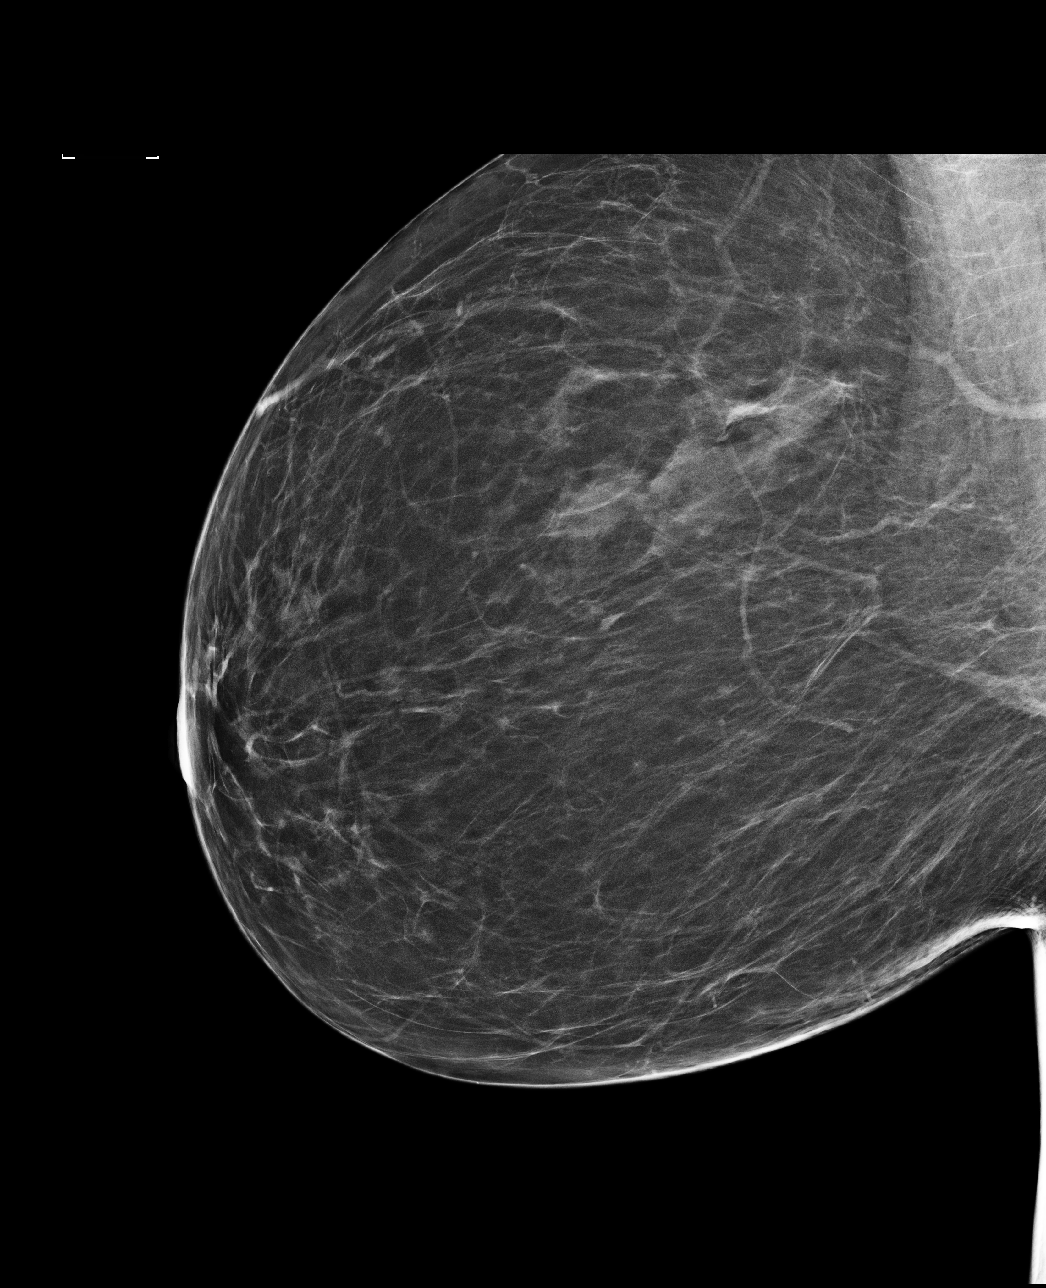

[R CC]
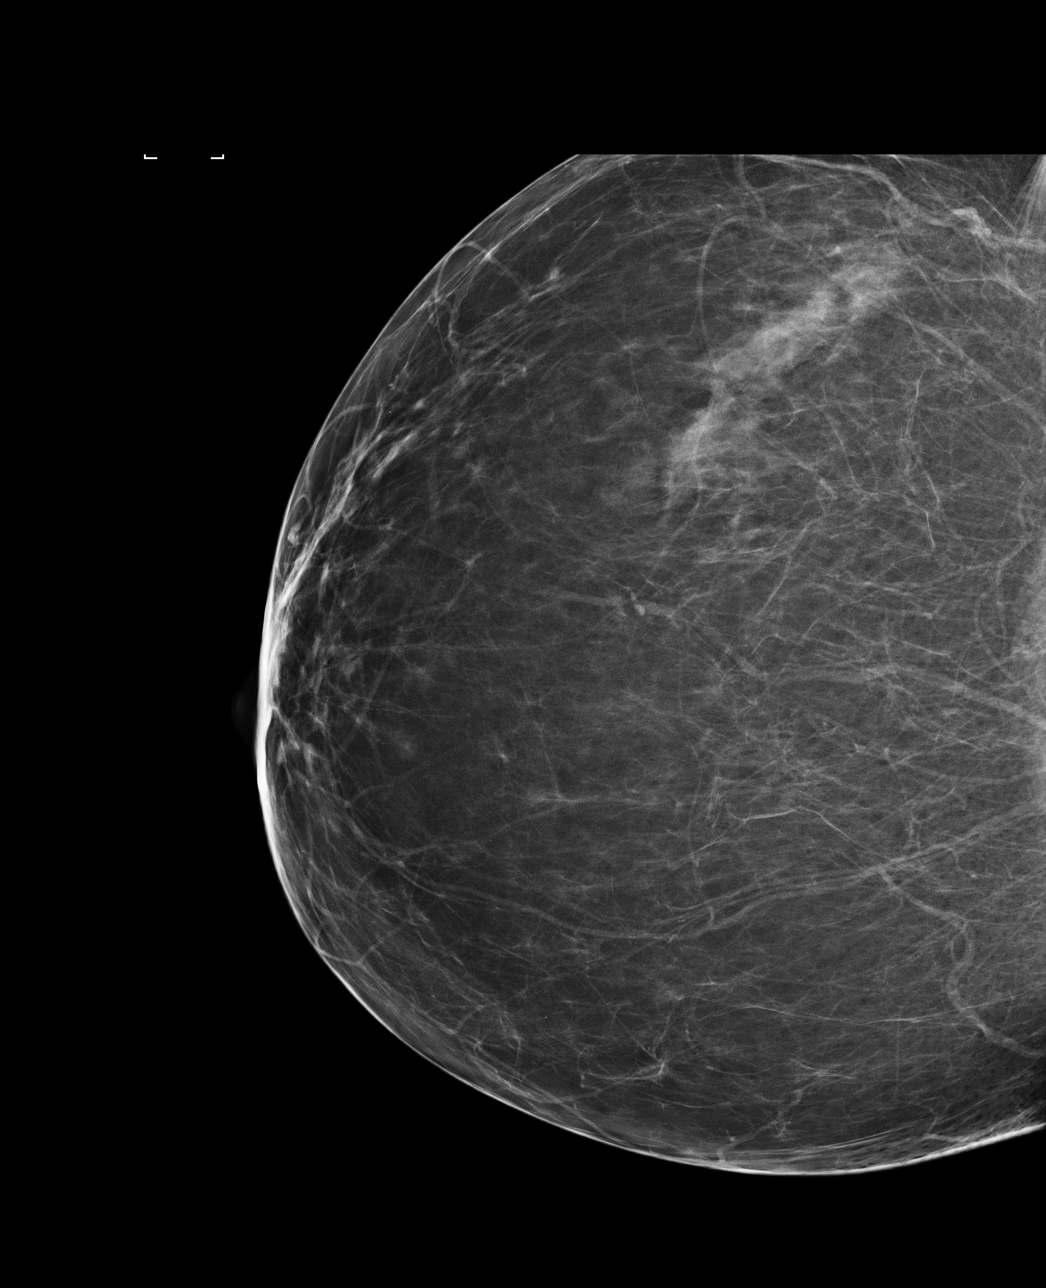

[L CC]
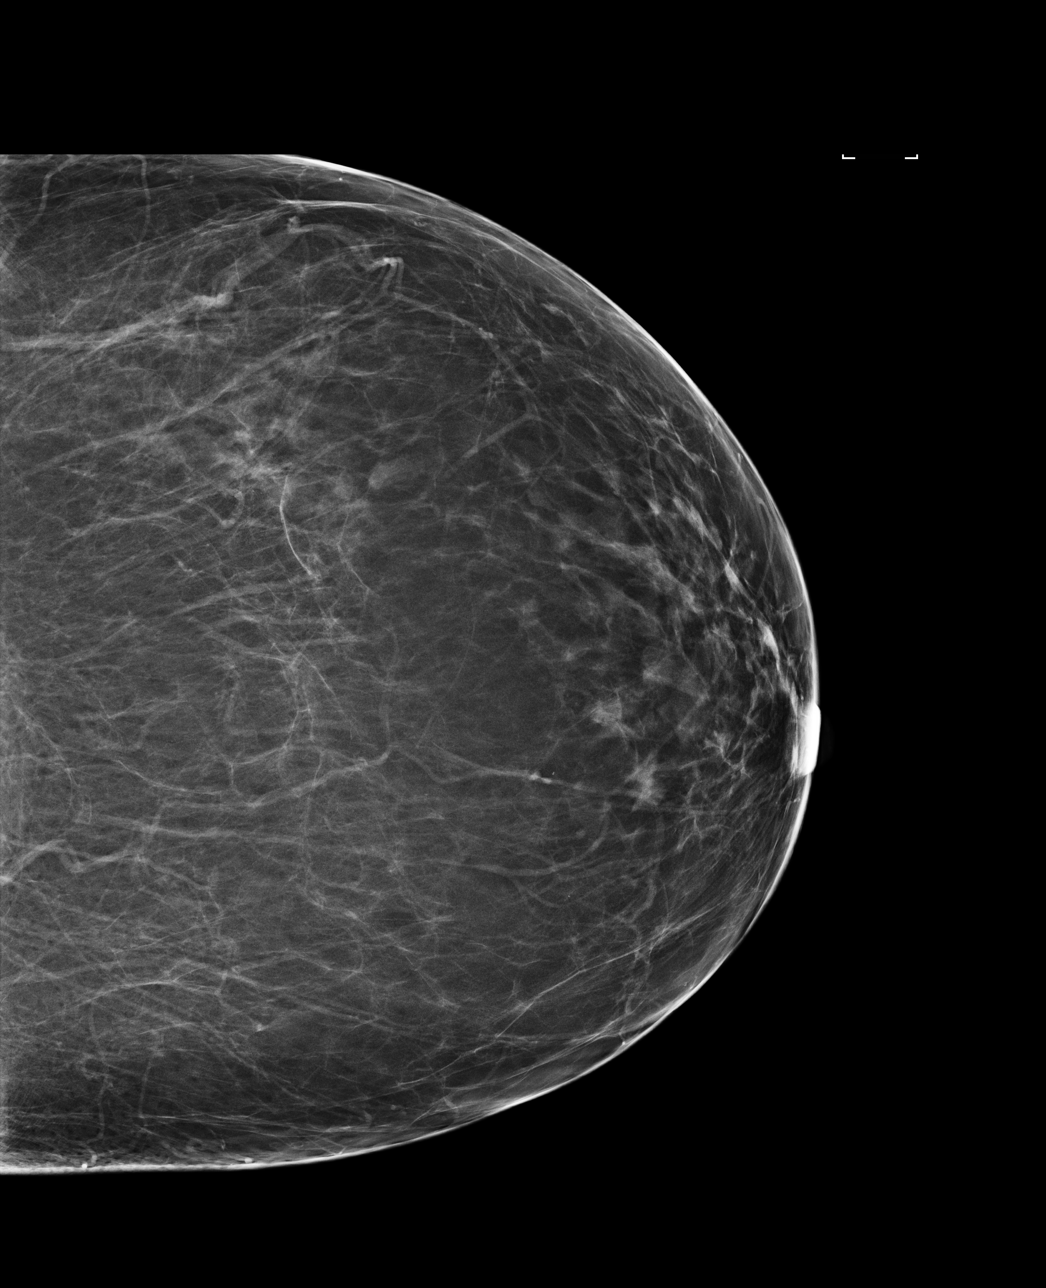

[4 of 4 positions shown; findings below may reference images not displayed]

ACR Breast Density Category b: There are scattered areas of
fibroglandular density.
FINDINGS: There are no findings suspicious for malignancy. Images were
processed with CAD.
IMPRESSION: No mammographic evidence of malignancy. A result letter of this
screening mammogram will be mailed directly to the patient.

RECOMMENDATION:
Screening mammogram in one year. (Code:AS-G-LCT)

BI-RADS CATEGORY  1: Negative.

## 2018-05-07 ENCOUNTER — Other Ambulatory Visit: Payer: Self-pay | Admitting: Family Medicine

## 2018-08-03 ENCOUNTER — Encounter: Payer: Self-pay | Admitting: Family Medicine

## 2018-08-14 ENCOUNTER — Other Ambulatory Visit: Payer: Self-pay | Admitting: Family Medicine

## 2018-08-27 ENCOUNTER — Encounter: Payer: Self-pay | Admitting: Family Medicine

## 2018-08-27 ENCOUNTER — Ambulatory Visit: Payer: No Typology Code available for payment source | Admitting: Family Medicine

## 2018-08-27 VITALS — BP 122/84 | Temp 98.8°F | Wt 196.2 lb

## 2018-08-27 DIAGNOSIS — R42 Dizziness and giddiness: Secondary | ICD-10-CM | POA: Diagnosis not present

## 2018-08-27 DIAGNOSIS — I10 Essential (primary) hypertension: Secondary | ICD-10-CM

## 2018-08-27 DIAGNOSIS — I951 Orthostatic hypotension: Secondary | ICD-10-CM

## 2018-08-27 DIAGNOSIS — K921 Melena: Secondary | ICD-10-CM | POA: Diagnosis not present

## 2018-08-27 MED ORDER — OMEPRAZOLE 20 MG PO CPDR: 20.0000 mg | DELAYED_RELEASE_CAPSULE | Freq: Every day | ORAL | 3 refills | Status: DC

## 2018-08-27 NOTE — Progress Notes (Signed)
   Subjective:    Patient ID: Anita Shea, female    DOB: 1960-08-10, 58 y.o.   MRN: [id number]  Dizziness  This is a new problem. The current episode started in the past 7 days. Pertinent negatives include no abdominal pain, chest pain, congestion, coughing, fatigue, fever, headaches or nausea.   Anita Shea states Anita Shea was dusting on Saturday and began to see black spots and fell backwards onto the bed. Anita Shea states Anita Shea does not feel line her self.   Anita Shea has changed from zantac to omeprazole. Anita Shea has not been taking potassium in the evening as directed.   Anita Shea did have bright red blood in stool last week. No more occurences of blood in stool at this time   Review of Systems  Constitutional: Negative for activity change, fatigue and fever.  HENT: Negative for congestion and rhinorrhea.   Respiratory: Negative for cough, chest tightness and shortness of breath.   Cardiovascular: Negative for chest pain and leg swelling.  Gastrointestinal: Negative for abdominal pain and nausea.  Skin: Negative for color change.  Neurological: Positive for dizziness. Negative for headaches.  Psychiatric/Behavioral: Negative for agitation and behavioral problems.       Objective:   Physical Exam Vitals signs reviewed.  Constitutional:      General: Anita Shea is not in acute distress. HENT:     Head: Normocephalic.  Cardiovascular:     Rate and Rhythm: Normal rate and regular rhythm.     Heart sounds: Normal heart sounds. No murmur.  Pulmonary:     Effort: Pulmonary effort is normal.     Breath sounds: Normal breath sounds.  Lymphadenopathy:     Cervical: No cervical adenopathy.  Neurological:     Mental Status: Anita Shea is alert.  Psychiatric:        Behavior: Behavior normal.    Finger-to-nose normal Romberg negative no nystagmus normal strength normal walking blood pressure laying sitting standing fine       Assessment & Plan:  More than likely orthostasis Probably contributed by standing for significant  length of time helping her parents Lab work ordered Blood pressure overall looks good I do not feel the patient had a stroke I would not recommend MRI at this point. Warnings discussed follow-up if progressive troubles  Small amount of blood in the stool it is recommended to do stool test for blood as well as CBC we will also touch base with Dr. Melony Overly to see what his input would be regarding this patient is up-to-date on colonoscopy I do not feel another one needs to be repeated at this point unless testing points toward other issues

## 2018-08-28 LAB — CBC WITH DIFFERENTIAL/PLATELET
BASOS ABS: 0.1 10*3/uL (ref 0.0–0.2)
Basos: 1 %
EOS (ABSOLUTE): 0.2 10*3/uL (ref 0.0–0.4)
Eos: 3 %
HEMOGLOBIN: 14.2 g/dL (ref 11.1–15.9)
Hematocrit: 41 % (ref 34.0–46.6)
IMMATURE GRANS (ABS): 0 10*3/uL (ref 0.0–0.1)
Immature Granulocytes: 0 %
LYMPHS ABS: 3.7 10*3/uL — AB (ref 0.7–3.1)
LYMPHS: 39 %
MCH: 29.6 pg (ref 26.6–33.0)
MCHC: 34.6 g/dL (ref 31.5–35.7)
MCV: 86 fL (ref 79–97)
MONOCYTES: 8 %
Monocytes Absolute: 0.7 10*3/uL (ref 0.1–0.9)
Neutrophils Absolute: 4.7 10*3/uL (ref 1.4–7.0)
Neutrophils: 49 %
Platelets: 339 10*3/uL (ref 150–450)
RBC: 4.79 x10E6/uL (ref 3.77–5.28)
RDW: 13 % (ref 11.7–15.4)
WBC: 9.4 10*3/uL (ref 3.4–10.8)

## 2018-08-28 LAB — BASIC METABOLIC PANEL
BUN / CREAT RATIO: 18 (ref 9–23)
BUN: 17 mg/dL (ref 6–24)
CALCIUM: 9.5 mg/dL (ref 8.7–10.2)
CO2: 26 mmol/L (ref 20–29)
CREATININE: 0.96 mg/dL (ref 0.57–1.00)
Chloride: 101 mmol/L (ref 96–106)
GFR calc non Af Amer: 66 mL/min/{1.73_m2} (ref >59)
GFR, EST AFRICAN AMERICAN: 76 mL/min/{1.73_m2} (ref >59)
GLUCOSE: 96 mg/dL (ref 65–99)
Potassium: 3.7 mmol/L (ref 3.5–5.2)
Sodium: 141 mmol/L (ref 134–144)

## 2018-08-28 LAB — MAGNESIUM: MAGNESIUM: 2.1 mg/dL (ref 1.6–2.3)

## 2018-08-29 ENCOUNTER — Other Ambulatory Visit: Payer: Self-pay | Admitting: *Deleted

## 2018-08-29 DIAGNOSIS — K921 Melena: Secondary | ICD-10-CM

## 2018-08-29 LAB — IFOBT (OCCULT BLOOD): IFOBT: NEGATIVE

## 2018-09-05 ENCOUNTER — Encounter: Payer: Self-pay | Admitting: Family Medicine

## 2018-09-06 NOTE — Telephone Encounter (Signed)
Called pt and told her that Terri recommended the hemoccult cards to check for hidden blood and pt states that Dr. Laural Golden and DR. Nicki Reaper both know how she feels about Terri. She has steered her wrong in the past and she is not a specialist and does not want to do an unnecessary test because her insurance does not cover anything dealing with diverticulitis since it is preexisting. States she only saw blood the one time and none since. Wanted to know if Dr. Laural Golden would feel this test was necessary?

## 2018-09-06 NOTE — Telephone Encounter (Signed)
Discussed with pt. Pt verbalized understanding. She states she trust dr Nicki Reaper and will go pick up test at dr rehman's office.

## 2018-09-06 NOTE — Telephone Encounter (Signed)
This is a difficult question to answer  Anytime a person has had some blood and has been a period of time since their last colonoscopy-in other words greater than 1 year it does raise some concern  We did discuss this with Anita Shea they recommended the Hemoccult cards x3.  This does seem to be a reasonable test that can check to make sure there is no blood in 3 separate stools  If the patient does not feel this is reasonable I highly encouraged the patient to directly speak to gastroenterology and discuss the case with Dr.Rehman  There is a possibility that they will want to see her.

## 2018-10-28 ENCOUNTER — Other Ambulatory Visit: Payer: Self-pay | Admitting: Family Medicine

## 2018-11-21 ENCOUNTER — Other Ambulatory Visit: Payer: Self-pay | Admitting: Family Medicine

## 2018-11-21 ENCOUNTER — Telehealth: Payer: Self-pay | Admitting: Family Medicine

## 2018-11-21 MED ORDER — DOXYCYCLINE HYCLATE 100 MG PO TABS: 100.0000 mg | ORAL_TABLET | Freq: Two times a day (BID) | ORAL | 0 refills | Status: DC

## 2018-11-21 NOTE — Telephone Encounter (Signed)
I had a discussion with the patient she does have some area of redness there along with significant tenderness given her symptomatology I would recommend doxycycline twice daily for the next 10 days take with a snack tall glass of water warning signs were discussed in detail Medication sent to [facility] [address]

## 2018-11-21 NOTE — Telephone Encounter (Signed)
Pt has removed a tick from her "private" area.  Pretty sure it had been there since Sunday.  Pt felt what she thought was a bump or boil that was pretty uncomfortable & was gonna give it some time the see her GYN if necessary.  Pt's daughter mentioned that it could be a tick so pt & her husband checked and it was.  Husband removed the tick (in about 3 pieces)   Pt states she cleaned the tweezers & the spot & used some neosporin  Wonders if there's anything else she should be doing or what to watch for due to it was attached for several days.  Please advise & call pt     [place]

## 2019-01-23 ENCOUNTER — Other Ambulatory Visit: Payer: Self-pay

## 2019-01-23 ENCOUNTER — Encounter: Payer: Self-pay | Admitting: Family Medicine

## 2019-01-23 ENCOUNTER — Ambulatory Visit (INDEPENDENT_AMBULATORY_CARE_PROVIDER_SITE_OTHER): Payer: No Typology Code available for payment source | Admitting: Family Medicine

## 2019-01-23 DIAGNOSIS — Z20822 Contact with and (suspected) exposure to covid-19: Secondary | ICD-10-CM

## 2019-01-23 DIAGNOSIS — B349 Viral infection, unspecified: Secondary | ICD-10-CM

## 2019-01-23 MED ORDER — AZITHROMYCIN 250 MG PO TABS: ORAL_TABLET | ORAL | 0 refills | Status: DC

## 2019-01-23 NOTE — Progress Notes (Signed)
   Subjective:    Patient ID: Anita Shea, female    DOB: Mar 22, 1961, 58 y.o.   MRN: [id number]  HPIwoke up last Saturday with a scratchy throat. Then started having a headache and not feeling great. No fever.  Patient with sore throat also some headache not feeling good low energy denies high fever chills sweats wheezing difficulty breathing PMH benign Virtual Visit via Telephone Note  I connected with Anita Shea on 01/23/19 at  3:50 PM EDT by telephone and verified that I am speaking with the correct person using two identifiers.  Location: Patient: home Provider: office   I discussed the limitations, risks, security and privacy concerns of performing an evaluation and management service by telephone and the availability of in person appointments. I also discussed with the patient that there may be a patient responsible charge related to this service. The patient expressed understanding and agreed to proceed.   History of Present Illness:    Observations/Objective:   Assessment and Plan:   Follow Up Instructions:    I discussed the assessment and treatment plan with the patient. The patient was provided an opportunity to ask questions and all were answered. The patient agreed with the plan and demonstrated an understanding of the instructions.   The patient was advised to call back or seek an in-person evaluation if the symptoms worsen or if the condition fails to improve as anticipated.  I provided 15 minutes of non-face-to-face time during this encounter.       Review of Systems  Constitutional: Negative for activity change and fever.  HENT: Positive for congestion, rhinorrhea and sore throat. Negative for ear pain.   Eyes: Negative for discharge.  Respiratory: Negative for cough, shortness of breath and wheezing.   Cardiovascular: Negative for chest pain.       Objective:   Physical Exam   Today's visit was via telephone Physical exam was not  possible for this visit      Assessment & Plan:  Possibility of coronavirus Recommend testing Self-isolation next 10 days unless dramatically better and test is negative Warning signs discussed in detail

## 2019-01-23 NOTE — Telephone Encounter (Signed)
I called pt and scheduled her virtual appt for today.

## 2019-01-25 LAB — NOVEL CORONAVIRUS, NAA: SARS-CoV-2, NAA: NOT DETECTED

## 2019-01-29 ENCOUNTER — Other Ambulatory Visit: Payer: Self-pay | Admitting: Family Medicine

## 2019-02-13 ENCOUNTER — Other Ambulatory Visit: Payer: Self-pay | Admitting: Family Medicine

## 2019-04-30 ENCOUNTER — Other Ambulatory Visit: Payer: Self-pay | Admitting: Family Medicine

## 2019-06-06 ENCOUNTER — Other Ambulatory Visit: Payer: Self-pay

## 2019-06-07 ENCOUNTER — Other Ambulatory Visit: Payer: Self-pay

## 2019-06-07 DIAGNOSIS — Z20822 Contact with and (suspected) exposure to covid-19: Secondary | ICD-10-CM

## 2019-06-08 LAB — NOVEL CORONAVIRUS, NAA: SARS-CoV-2, NAA: NOT DETECTED

## 2019-08-13 ENCOUNTER — Other Ambulatory Visit: Payer: Self-pay | Admitting: Family Medicine

## 2019-08-14 NOTE — Telephone Encounter (Signed)
1 refill needs either virtual or in person follow-up

## 2019-08-14 NOTE — Telephone Encounter (Signed)
Patient scheduled medication follow up for 3/12.

## 2019-09-06 ENCOUNTER — Ambulatory Visit (INDEPENDENT_AMBULATORY_CARE_PROVIDER_SITE_OTHER): Payer: No Typology Code available for payment source | Admitting: Family Medicine

## 2019-09-06 ENCOUNTER — Other Ambulatory Visit: Payer: Self-pay

## 2019-09-06 VITALS — BP 114/82 | Temp 97.9°F | Wt 189.4 lb

## 2019-09-06 DIAGNOSIS — R1084 Generalized abdominal pain: Secondary | ICD-10-CM | POA: Diagnosis not present

## 2019-09-06 DIAGNOSIS — I10 Essential (primary) hypertension: Secondary | ICD-10-CM | POA: Diagnosis not present

## 2019-09-06 DIAGNOSIS — Z13220 Encounter for screening for lipoid disorders: Secondary | ICD-10-CM | POA: Diagnosis not present

## 2019-09-06 DIAGNOSIS — K219 Gastro-esophageal reflux disease without esophagitis: Secondary | ICD-10-CM | POA: Diagnosis not present

## 2019-09-06 MED ORDER — DICYCLOMINE HCL 20 MG PO TABS: 20.0000 mg | ORAL_TABLET | Freq: Three times a day (TID) | ORAL | 3 refills | Status: DC | PRN

## 2019-09-06 NOTE — Progress Notes (Signed)
   Subjective:    Patient ID: Anita Shea, female    DOB: [birth year], 59 y.o.   MRN: [id number]  Hypertension This is a chronic problem. The current episode started more than 1 year ago. Pertinent negatives include no chest pain or shortness of breath. Treatments tried: HCTZ. There are no compliance problems.    Patient would like a refill of Dicyclomine 20mg  to [facility] [address] to have in case of stomach issues- has not filled in 2 years Intermittent abdominal cramps under some stress because daughter is going through anxiousness  Patient also wants to see if it is ok she is taking Claritin in the am and zyrtec at bedtime.  Taking 1 would be fine but it is okay to take both if necessary   Review of Systems  Constitutional: Negative for activity change, appetite change and fatigue.  HENT: Negative for congestion and rhinorrhea.   Respiratory: Negative for cough and shortness of breath.   Cardiovascular: Negative for chest pain and leg swelling.  Gastrointestinal: Negative for abdominal pain and diarrhea.  Endocrine: Negative for polydipsia and polyphagia.  Skin: Negative for color change.  Neurological: Negative for dizziness and weakness.  Psychiatric/Behavioral: Negative for behavioral problems and confusion.       Objective:   Physical Exam Vitals reviewed.  Constitutional:      General: She is not in acute distress. HENT:     Head: Normocephalic and atraumatic.  Eyes:     General:        Right eye: No discharge.        Left eye: No discharge.  Neck:     Trachea: No tracheal deviation.  Cardiovascular:     Rate and Rhythm: Normal rate and regular rhythm.     Heart sounds: Normal heart sounds. No murmur.  Pulmonary:     Effort: Pulmonary effort is normal. No respiratory distress.     Breath sounds: Normal breath sounds.  Lymphadenopathy:     Cervical: No cervical adenopathy.  Skin:    General: Skin is warm and dry.  Neurological:     Mental Status: She is  alert.     Coordination: Coordination normal.  Psychiatric:        Behavior: Behavior normal.           Assessment & Plan:  1. Essential hypertension, benign Blood pressure good control continue current measures watch diet closely - Lipase - Comprehensive metabolic panel  2. Gastroesophageal reflux disease without esophagitis Has intermittent troubles omeprazole helps but bed on 6 inch blocks  3. Generalized abdominal pain Intermittent abdominal pain not severe but we will check lab work await results  4. Screening, lipid Screening lipid recommended.  Await results - Lipid panel

## 2019-11-16 ENCOUNTER — Other Ambulatory Visit: Payer: Self-pay | Admitting: Family Medicine

## 2019-12-17 ENCOUNTER — Other Ambulatory Visit: Payer: Self-pay | Admitting: Family Medicine

## 2020-01-06 ENCOUNTER — Other Ambulatory Visit: Payer: Self-pay

## 2020-01-06 ENCOUNTER — Ambulatory Visit (INDEPENDENT_AMBULATORY_CARE_PROVIDER_SITE_OTHER): Payer: No Typology Code available for payment source | Admitting: Family Medicine

## 2020-01-06 VITALS — BP 116/80 | Temp 97.5°F | Wt 185.8 lb

## 2020-01-06 DIAGNOSIS — K5792 Diverticulitis of intestine, part unspecified, without perforation or abscess without bleeding: Secondary | ICD-10-CM | POA: Diagnosis not present

## 2020-01-06 MED ORDER — AMOXICILLIN-POT CLAVULANATE 875-125 MG PO TABS: 1.0000 | ORAL_TABLET | Freq: Two times a day (BID) | ORAL | 0 refills | Status: DC

## 2020-01-06 NOTE — Progress Notes (Signed)
   Subjective:    Patient ID: Anita Shea, female    DOB: 11-01-1960, 59 y.o.   MRN: [id number]  HPI  Patient arrives with fever and severe abdominal pain that started Saturday. Patient states he has had no further fevers today. History of diverticulitis Lower abdominal discomfort Fever over the weekend but fever now gone Pain is doing better Denies dysuria nausea vomiting. Review of Systems    Please see above Objective:   Physical Exam  Lungs clear respiratory rate normal heart regular flanks nontender lower abdomen mild tenderness no guarding or rebound      Assessment & Plan:  Probable diverticulitis Recheck 24 hours Antibiotic sent in Warning signs discussed follow-up if problems Patient did not tolerate Cipro or Flagyl on previous bout with this We will go with Augmentin 875 mg twice daily Hold off on labs and CAT scan at this time

## 2020-01-07 ENCOUNTER — Ambulatory Visit: Payer: No Typology Code available for payment source | Admitting: Family Medicine

## 2020-01-07 ENCOUNTER — Ambulatory Visit (INDEPENDENT_AMBULATORY_CARE_PROVIDER_SITE_OTHER): Payer: No Typology Code available for payment source | Admitting: Family Medicine

## 2020-01-07 ENCOUNTER — Encounter: Payer: Self-pay | Admitting: Family Medicine

## 2020-01-07 ENCOUNTER — Other Ambulatory Visit (HOSPITAL_COMMUNITY)
Admission: RE | Admit: 2020-01-07 | Discharge: 2020-01-07 | Disposition: A | Discharge status: Home / Self Care | Payer: No Typology Code available for payment source | Source: Ambulatory Visit | Attending: Family Medicine | Admitting: Family Medicine

## 2020-01-07 VITALS — BP 132/84 | Temp 97.7°F | Wt 185.4 lb

## 2020-01-07 DIAGNOSIS — K5792 Diverticulitis of intestine, part unspecified, without perforation or abscess without bleeding: Secondary | ICD-10-CM

## 2020-01-07 DIAGNOSIS — R1084 Generalized abdominal pain: Secondary | ICD-10-CM

## 2020-01-07 LAB — HEPATIC FUNCTION PANEL
ALT: 36 U/L (ref 0–44)
AST: 27 U/L (ref 15–41)
Albumin: 4 g/dL (ref 3.5–5.0)
Alkaline Phosphatase: 68 U/L (ref 38–126)
Bilirubin, Direct: <0.1 mg/dL (ref 0.0–0.2)
Total Bilirubin: 0.4 mg/dL (ref 0.3–1.2)
Total Protein: 8.4 g/dL — ABNORMAL HIGH (ref 6.5–8.1)

## 2020-01-07 LAB — CBC WITH DIFFERENTIAL/PLATELET
Abs Immature Granulocytes: 0.01 10*3/uL (ref 0.00–0.07)
Basophils Absolute: 0 10*3/uL (ref 0.0–0.1)
Basophils Relative: 1 %
Eosinophils Absolute: 0.2 10*3/uL (ref 0.0–0.5)
Eosinophils Relative: 3 %
HCT: 39.6 % (ref 36.0–46.0)
Hemoglobin: 13.3 g/dL (ref 12.0–15.0)
Immature Granulocytes: 0 %
Lymphocytes Relative: 31 %
Lymphs Abs: 2.2 10*3/uL (ref 0.7–4.0)
MCH: 29.4 pg (ref 26.0–34.0)
MCHC: 33.6 g/dL (ref 30.0–36.0)
MCV: 87.4 fL (ref 80.0–100.0)
Monocytes Absolute: 0.7 10*3/uL (ref 0.1–1.0)
Monocytes Relative: 9 %
Neutro Abs: 3.9 10*3/uL (ref 1.7–7.7)
Neutrophils Relative %: 56 %
Platelets: 375 10*3/uL (ref 150–400)
RBC: 4.53 MIL/uL (ref 3.87–5.11)
RDW: 12.9 % (ref 11.5–15.5)
WBC: 7 10*3/uL (ref 4.0–10.5)
nRBC: 0 % (ref 0.0–0.2)

## 2020-01-07 LAB — BASIC METABOLIC PANEL WITH GFR
Anion gap: 10 (ref 5–15)
BUN: 12 mg/dL (ref 6–20)
CO2: 27 mmol/L (ref 22–32)
Calcium: 9 mg/dL (ref 8.9–10.3)
Chloride: 97 mmol/L — ABNORMAL LOW (ref 98–111)
Creatinine, Ser: 0.68 mg/dL (ref 0.44–1.00)
GFR calc Af Amer: >60 mL/min
GFR calc non Af Amer: >60 mL/min
Glucose, Bld: 105 mg/dL — ABNORMAL HIGH (ref 70–99)
Potassium: 3.1 mmol/L — ABNORMAL LOW (ref 3.5–5.1)
Sodium: 134 mmol/L — ABNORMAL LOW (ref 135–145)

## 2020-01-07 LAB — LIPASE, BLOOD: Lipase: 27 U/L (ref 11–51)

## 2020-01-07 NOTE — Progress Notes (Signed)
   Subjective:    Patient ID: Anita Shea, female    DOB: [birth year], 59 y.o.   MRN: [id number]  Abdominal Pain This is a recurrent problem. Episode onset: Saturday  The pain is located in the generalized abdominal region. The abdominal pain does not radiate. Treatments tried: antibiotics.   Pt here follow up per provider. Pt states she still having pain, had diarrhea last night and this morning. Pt was placed on Augmentin yesterday. Pt was able to tolerate a pop tart and soft roll with shaved [place]. Last night had some nausea. Pt unable to sleep last night due to feeling bad.    Review of Systems  Gastrointestinal: Positive for abdominal pain.       Objective:   Physical Exam Lower abdominal tenderness no guarding or rebound extremities no edema skin warm dry       Assessment & Plan:  Labs ordered Labs came back overall look good except for elevated protein which we will have to look into further I do not feel that that is related to her abdominal pain Patient will give [place] update in the morning how she is doing May need to have CT scan Continue Augmentin to treat for presumptive diverticulitis

## 2020-01-08 ENCOUNTER — Telehealth: Payer: Self-pay | Admitting: Family Medicine

## 2020-01-08 NOTE — Telephone Encounter (Signed)
Patient with abdominal discomfort We had phone conversation today Her abdominal discomfort is still moderate in the lower abdomen but she feels it slightly better compared to yesterday but not worse.  She would like to give this an additional day with the antibiotics before moving toward CAT scan.  I believe that this is a reasonable position.  We will pursue rechecking with the patient Thursday morning.  If not improving or getting worse recommend CT scan.

## 2020-01-13 ENCOUNTER — Other Ambulatory Visit: Payer: Self-pay | Admitting: *Deleted

## 2020-01-13 DIAGNOSIS — R779 Abnormality of plasma protein, unspecified: Secondary | ICD-10-CM

## 2020-01-14 ENCOUNTER — Encounter: Payer: Self-pay | Admitting: Family Medicine

## 2020-01-20 ENCOUNTER — Other Ambulatory Visit: Payer: Self-pay | Admitting: *Deleted

## 2020-01-20 DIAGNOSIS — R779 Abnormality of plasma protein, unspecified: Secondary | ICD-10-CM

## 2020-01-21 ENCOUNTER — Telehealth: Payer: Self-pay | Admitting: Family Medicine

## 2020-01-21 ENCOUNTER — Other Ambulatory Visit: Payer: Self-pay | Admitting: Family Medicine

## 2020-01-21 MED ORDER — AMOXICILLIN-POT CLAVULANATE 875-125 MG PO TABS: 1.0000 | ORAL_TABLET | Freq: Two times a day (BID) | ORAL | 0 refills | Status: DC

## 2020-01-21 NOTE — Telephone Encounter (Signed)
Nurses I spoke with patient this evening she is having pain and discomfort lower abdomen along with a fever of 100 this is despite taking antibiotics for presumed diverticulitis Patient has a history of diverticulitis Patient was recently seen and had prolonged symptoms and was told if her symptoms kept going or got worse she would need to have a CT scan  Patient with lower abdominal pain and discomfort Concerning for the possibility of diverticulitis Recently treated for diverticulitis Needs urgent CT scan abdomen pelvis with contrast to be done Wednesday or Thursday no later than Friday morning as a stat scan Await results Please put in consult for Dr. Dereck Leep  (Also should be noted patient was having some fever lower abdominal pain despite the antibiotics therefore antibiotics were reinitiated and patient will need to have CT scan to rule out diverticulitis she is just been seen recently several times for this) (If any problems please let me know)

## 2020-01-22 ENCOUNTER — Other Ambulatory Visit: Payer: Self-pay | Admitting: *Deleted

## 2020-01-22 ENCOUNTER — Other Ambulatory Visit: Payer: Self-pay

## 2020-01-22 ENCOUNTER — Ambulatory Visit (HOSPITAL_COMMUNITY)
Admission: RE | Admit: 2020-01-22 | Discharge: 2020-01-22 | Disposition: A | Discharge status: Home / Self Care | Payer: PRIVATE HEALTH INSURANCE | Source: Ambulatory Visit | Attending: Family Medicine | Admitting: Family Medicine

## 2020-01-22 ENCOUNTER — Encounter: Payer: Self-pay | Admitting: Family Medicine

## 2020-01-22 DIAGNOSIS — R109 Unspecified abdominal pain: Secondary | ICD-10-CM | POA: Insufficient documentation

## 2020-01-22 DIAGNOSIS — R509 Fever, unspecified: Secondary | ICD-10-CM

## 2020-01-22 DIAGNOSIS — R768 Other specified abnormal immunological findings in serum: Secondary | ICD-10-CM

## 2020-01-22 LAB — BASIC METABOLIC PANEL
BUN/Creatinine Ratio: 14 (ref 9–23)
BUN: 11 mg/dL (ref 6–24)
CO2: 25 mmol/L (ref 20–29)
Calcium: 9.5 mg/dL (ref 8.7–10.2)
Chloride: 93 mmol/L — ABNORMAL LOW (ref 96–106)
Creatinine, Ser: 0.78 mg/dL (ref 0.57–1.00)
GFR calc Af Amer: 97 mL/min/{1.73_m2} (ref >59)
GFR calc non Af Amer: 84 mL/min/{1.73_m2} (ref >59)
Glucose: 97 mg/dL (ref 65–99)
Potassium: 4.1 mmol/L (ref 3.5–5.2)
Sodium: 137 mmol/L (ref 134–144)

## 2020-01-22 LAB — PE AND FLC, SERUM
A/G Ratio: 1 (ref 0.7–1.7)
Albumin ELP: 3.9 g/dL (ref 2.9–4.4)
Alpha 1: 0.2 g/dL (ref 0.0–0.4)
Alpha 2: 0.8 g/dL (ref 0.4–1.0)
Beta: 1.5 g/dL — ABNORMAL HIGH (ref 0.7–1.3)
Gamma Globulin: 1.5 g/dL (ref 0.4–1.8)
Globulin, Total: 4.1 g/dL — ABNORMAL HIGH (ref 2.2–3.9)
Ig Kappa Free Light Chain: 24.2 mg/L — ABNORMAL HIGH (ref 3.3–19.4)
Ig Lambda Free Light Chain: 18.9 mg/L (ref 5.7–26.3)
KAPPA/LAMBDA RATIO: 1.28 (ref 0.26–1.65)
Total Protein: 8 g/dL (ref 6.0–8.5)

## 2020-01-22 LAB — TRYPTASE: Tryptase: 4.9 ug/L (ref 2.2–13.2)

## 2020-01-22 LAB — IMMUNOFIXATION, SERUM
IgA/Immunoglobulin A, Serum: 411 mg/dL — ABNORMAL HIGH (ref 87–352)
IgG (Immunoglobin G), Serum: 1465 mg/dL (ref 586–1602)
IgM (Immunoglobulin M), Srm: 74 mg/dL (ref 26–217)

## 2020-01-22 MED ORDER — IOHEXOL 300 MG/ML  SOLN
100.0000 mL | Freq: Once | INTRAMUSCULAR | Status: AC | PRN
  Administered 2020-01-22: 100 mL via INTRAVENOUS

## 2020-01-22 NOTE — Telephone Encounter (Signed)
CT scan scheduled today at [facility]- Patient notified and heading to hospital for scan.

## 2020-01-22 NOTE — Telephone Encounter (Signed)
No precert needed for CT scan per insurance- patient has a limited plan and will pay benefits as listed in plan- plan also does not pay for any tests or treatments associated suba diving

## 2020-01-22 NOTE — Telephone Encounter (Signed)
Dr Nicki Reaper is calling to speak with pt at radiology

## 2020-01-22 NOTE — Telephone Encounter (Signed)
Referral was ordered in [facility]

## 2020-01-22 NOTE — Telephone Encounter (Signed)
Referral put in and order for stat ct scan. Working on [employer] and then will schedule.

## 2020-01-22 NOTE — Telephone Encounter (Signed)
Patient aware of results, please see result note

## 2020-01-23 ENCOUNTER — Telehealth: Payer: Self-pay | Admitting: Family Medicine

## 2020-01-23 ENCOUNTER — Encounter (INDEPENDENT_AMBULATORY_CARE_PROVIDER_SITE_OTHER): Payer: Self-pay | Admitting: Gastroenterology

## 2020-01-23 ENCOUNTER — Other Ambulatory Visit: Payer: Self-pay | Admitting: Family Medicine

## 2020-01-23 NOTE — Telephone Encounter (Signed)
Front Please reach out to the patient to set up a follow-up regarding her diverticulitis toward the end of next week with me thank you

## 2020-01-27 ENCOUNTER — Encounter (HOSPITAL_COMMUNITY): Payer: Self-pay

## 2020-01-27 ENCOUNTER — Other Ambulatory Visit: Payer: Self-pay

## 2020-01-28 ENCOUNTER — Inpatient Hospital Stay (HOSPITAL_COMMUNITY): Payer: No Typology Code available for payment source | Attending: Hematology | Admitting: Hematology

## 2020-01-28 VITALS — BP 137/89 | HR 90 | Temp 98.6°F | Resp 18 | Ht 67.75 in | Wt 187.3 lb

## 2020-01-28 DIAGNOSIS — R1030 Lower abdominal pain, unspecified: Secondary | ICD-10-CM | POA: Diagnosis not present

## 2020-01-28 DIAGNOSIS — R778 Other specified abnormalities of plasma proteins: Secondary | ICD-10-CM | POA: Diagnosis not present

## 2020-01-28 DIAGNOSIS — R768 Other specified abnormal immunological findings in serum: Secondary | ICD-10-CM | POA: Diagnosis not present

## 2020-01-28 DIAGNOSIS — I10 Essential (primary) hypertension: Secondary | ICD-10-CM | POA: Diagnosis not present

## 2020-01-28 NOTE — Progress Notes (Signed)
[facility] [address], [place] [address]   CLINIC:  Medical Oncology/Hematology  Patient Care Team: Kathyrn Drown, MD as PCP - General (Family Medicine)  CHIEF COMPLAINTS/PURPOSE OF CONSULTATION:  Evaluation of elevated Ig free light chain  HISTORY OF PRESENTING ILLNESS:  Anita Shea 59 y.o. female is here because of elevated Ig free light chain, at the request of Dr. Sallee Lange of [facility].  Today she reports that she had diverticulitis 3 years ago and then had another episode in 01/04/2020, which is treated with Augmentin x 20 days. She still has some ongoing abdominal pain from her episode of diverticulitis. She also notes that her left leg pain went away as she is dealing with diverticulitis. She has never had any blood transfusion. She denies any autoimmune conditions, F/C, night sweats nor recent weight loss.   Her mother has lupus and lung cancer. She works as a Pharmacist, hospital. She is a non-smoker and non-drinker.   MEDICAL HISTORY:  Past Medical History:  Diagnosis Date  . Complication of anesthesia   . Diverticulitis of colon 08/30/2016  . Hypertension     SURGICAL HISTORY: Past Surgical History:  Procedure Laterality Date  . CHOLECYSTECTOMY     1993, flares  . COLONOSCOPY N/A 03/08/2017   Procedure: COLONOSCOPY;  Surgeon: Rogene Houston, MD;  Location: [facility];  Service: Endoscopy;  Laterality: N/A;  1200-rescheduled to 9/12 @ 1:00  . POLYPECTOMY  03/08/2017   Procedure: POLYPECTOMY;  Surgeon: Rogene Houston, MD;  Location: [facility];  Service: Endoscopy;;  . WISDOM TOOTH EXTRACTION      SOCIAL HISTORY: Social History   Socioeconomic History  . Marital status: Married    Spouse name: Not on file  . Number of children: 3  . Years of education: Not on file  . Highest education level: Not on file  Occupational History  . Occupation: EMPLOYED  Tobacco Use  . Smoking status: Never Smoker  . Smokeless tobacco: Never Used    Vaping Use  . Vaping Use: Never used  Substance and Sexual Activity  . Alcohol use: No  . Drug use: No  . Sexual activity: Yes  Other Topics Concern  . Not on file  Social History Narrative  . Not on file   Social Determinants of Health   Financial Resource Strain:   . Difficulty of Paying Living Expenses:   Food Insecurity:   . Worried About Charity fundraiser in the Last Year:   . Arboriculturist in the Last Year:   Transportation Needs:   . Film/video editor (Medical):   Marland Kitchen Lack of Transportation (Non-Medical):   Physical Activity:   . Days of Exercise per Week:   . Minutes of Exercise per Session:   Stress:   . Feeling of Stress :   Social Connections:   . Frequency of Communication with Friends and Family:   . Frequency of Social Gatherings with Friends and Family:   . Attends Religious Services:   . Active Member of Clubs or Organizations:   . Attends Archivist Meetings:   Marland Kitchen Marital Status:   Intimate Partner Violence:   . Fear of Current or Ex-Partner:   . Emotionally Abused:   Marland Kitchen Physically Abused:   . Sexually Abused:     FAMILY HISTORY: Family History  Problem Relation Age of Onset  . Stroke Mother   . Lung cancer Mother   . Heart disease  Father   . Heart attack Father   . Healthy Other   . Healthy Other   . Diabetes Brother   . Heart attack Brother   . Hypertension Sister   . Heart attack Maternal Grandfather   . Heart attack Paternal Grandfather   . Healthy Daughter     ALLERGIES:  is allergic to aspirin, lisinopril, and nsaids.  MEDICATIONS:  Current Outpatient Medications  Medication Sig Dispense Refill  . amoxicillin-clavulanate (AUGMENTIN) 875-125 MG tablet Take 1 tablet by mouth 2 (two) times daily. 20 tablet 0  . Calcium Citrate-Vitamin D (CITRACAL + D PO) Take 1-2 tablets by mouth 2 (two) times daily. Takes 2 in the AM then 1 in the PM    . cetirizine (ZYRTEC) 10 MG tablet Take 10 mg by mouth daily.    Marland Kitchen dicyclomine  (BENTYL) 20 MG tablet Take 1 tablet (20 mg total) by mouth 3 (three) times daily as needed for spasms. (Patient not taking: Reported on 01/27/2020) 60 tablet 3  . docusate sodium (COLACE) 100 MG capsule Take 2 capsules (200 mg total) by mouth daily. 60 capsule 0  . hydrochlorothiazide (HYDRODIURIL) 25 MG tablet TAKE 1 TABLET BY MOUTH ONCE DAILY. 30 tablet 2  . Multiple Vitamin (MULTIVITAMIN) tablet Take 1 tablet by mouth daily.    Marland Kitchen omeprazole (PRILOSEC) 20 MG capsule Take 1 capsule (20 mg total) by mouth daily. 30 capsule 3  . potassium chloride (KLOR-CON) 10 MEQ tablet TAKE 2 TABLETS IN THE MORNING AND 1 TABLET IN THE AFTERNOON. 90 tablet 0  . Probiotic Product (PROBIOTIC DAILY PO) Take 1 capsule by mouth daily. culturelle 20,000 billion one daily     . simethicone (MYLICON) 453 MG chewable tablet Chew 250 mg by mouth at bedtime.     No current facility-administered medications for this visit.    REVIEW OF SYSTEMS:   Review of Systems  Constitutional: Positive for appetite change (moderately decreased) and fatigue (moderate). Negative for chills, diaphoresis, fever and unexpected weight change.  Gastrointestinal: Positive for abdominal pain (4/10 abdominal pain).  Skin: Negative for rash.  All other systems reviewed and are negative.    PHYSICAL EXAMINATION: ECOG PERFORMANCE STATUS: 1 - Symptomatic but completely ambulatory  Vitals:   01/28/20 0944  BP: 137/89  Pulse: 90  Resp: 18  Temp: 98.6 F (37 C)  SpO2: 96%   Filed Weights   01/28/20 0944  Weight: 187 lb 4.8 oz (85 kg)   Physical Exam Vitals reviewed.  Constitutional:      Appearance: Normal appearance.  Cardiovascular:     Rate and Rhythm: Normal rate and regular rhythm.     Pulses: Normal pulses.     Heart sounds: Normal heart sounds.  Pulmonary:     Effort: Pulmonary effort is normal.     Breath sounds: Normal breath sounds.  Abdominal:     Palpations: Abdomen is soft. There is no hepatomegaly, splenomegaly  or mass.     Tenderness: There is abdominal tenderness in the right upper quadrant and suprapubic area.  Lymphadenopathy:     Cervical: No cervical adenopathy.     Upper Body:     Right upper body: No supraclavicular or axillary adenopathy.     Left upper body: No supraclavicular or axillary adenopathy.  Neurological:     General: No focal deficit present.     Mental Status: She is alert and oriented to person, place, and time.  Psychiatric:        Mood and Affect:  Mood normal.        Behavior: Behavior normal.      LABORATORY DATA:  I have reviewed the data as listed Recent Results (from the past 2160 hour(s))  CBC with Differential     Status: None   Collection Time: 01/07/20  5:02 PM  Result Value Ref Range   WBC 7.0 4.0 - 10.5 K/uL   RBC 4.53 3.87 - 5.11 MIL/uL   Hemoglobin 13.3 12.0 - 15.0 g/dL   HCT 39.6 36 - 46 %   MCV 87.4 80.0 - 100.0 fL   MCH 29.4 26.0 - 34.0 pg   MCHC 33.6 30.0 - 36.0 g/dL   RDW 12.9 11.5 - 15.5 %   Platelets 375 150 - 400 K/uL   nRBC 0.0 0.0 - 0.2 %   Neutrophils Relative % 56 %   Neutro Abs 3.9 1.7 - 7.7 K/uL   Lymphocytes Relative 31 %   Lymphs Abs 2.2 0.7 - 4.0 K/uL   Monocytes Relative 9 %   Monocytes Absolute 0.7 0 - 1 K/uL   Eosinophils Relative 3 %   Eosinophils Absolute 0.2 0 - 0 K/uL   Basophils Relative 1 %   Basophils Absolute 0.0 0 - 0 K/uL   Immature Granulocytes 0 %   Abs Immature Granulocytes 0.01 0.00 - 0.07 K/uL    Comment: Performed at [facility], [address], [place], [place] [address]  Basic metabolic panel     Status: Abnormal   Collection Time: 01/07/20  5:02 PM  Result Value Ref Range   Sodium 134 (L) 135 - 145 mmol/L   Potassium 3.1 (L) 3.5 - 5.1 mmol/L   Chloride 97 (L) 98 - 111 mmol/L   CO2 27 22 - 32 mmol/L   Glucose, Bld 105 (H) 70 - 99 mg/dL    Comment: Glucose reference range applies only to samples taken after fasting for at least 8 hours.   BUN 12 6 - 20 mg/dL   Creatinine, Ser 0.68 0.44 - 1.00  mg/dL   Calcium 9.0 8.9 - 10.3 mg/dL   GFR calc non Af Amer >60 >60 mL/min   GFR calc Af Amer >60 >60 mL/min   Anion gap 10 5 - 15    Comment: Performed at [facility], [address], [place], [place] [address]  Hepatic function panel     Status: Abnormal   Collection Time: 01/07/20  5:02 PM  Result Value Ref Range   Total Protein 8.4 (H) 6.5 - 8.1 g/dL   Albumin 4.0 3.5 - 5.0 g/dL   AST 27 15 - 41 U/L   ALT 36 0 - 44 U/L   Alkaline Phosphatase 68 38 - 126 U/L   Total Bilirubin 0.4 0.3 - 1.2 mg/dL   Bilirubin, Direct <0.1 0.0 - 0.2 mg/dL   Indirect Bilirubin NOT CALCULATED 0.3 - 0.9 mg/dL    Comment: Performed at [facility], [address] [place], [place] [address]  Lipase, blood     Status: None   Collection Time: 01/07/20  5:02 PM  Result Value Ref Range   Lipase 27 11 - 51 U/L    Comment: Performed at [facility], [address] [place], [place] [address]  Basic metabolic panel     Status: Abnormal   Collection Time: 01/20/20  8:46 AM  Result Value Ref Range   Glucose 97 65 - 99 mg/dL   BUN 11 6 - 24 mg/dL   Creatinine, Ser 0.78 0.57 - 1.00  mg/dL   GFR calc non Af Amer 84 >59 mL/min/1.73   GFR calc Af Amer 97 >59 mL/min/1.73    Comment: **[facility] currently reports eGFR in compliance with the current**   recommendations of the [employer]. [facility] will   update reporting as new guidelines are published from the NKF-ASN   Task force.    BUN/Creatinine Ratio 14 9 - 23   Sodium 137 134 - 144 mmol/L   Potassium 4.1 3.5 - 5.2 mmol/L   Chloride 93 (L) 96 - 106 mmol/L   CO2 25 20 - 29 mmol/L   Calcium 9.5 8.7 - 10.2 mg/dL  PE and FLC, Serum     Status: Abnormal   Collection Time: 01/20/20  8:46 AM  Result Value Ref Range   Total Protein 8.0 6.0 - 8.5 g/dL   Albumin ELP 3.9 2.9 - 4.4 g/dL   Alpha 1 0.2 0.0 - 0.4 g/dL   Alpha 2 0.8 0.4 - 1.0 g/dL   Beta 1.5 (H) 0.7 - 1.3 g/dL   Gamma Globulin 1.5 0.4 - 1.8 g/dL   M-Spike, % Not Observed Not Observed  g/dL   Globulin, Total 4.1 (H) 2.2 - 3.9 g/dL   A/G Ratio 1.0 0.7 - 1.7   Please Note: Comment     Comment: Protein electrophoresis scan will follow via computer, mail, or courier delivery.    Ig Kappa Free Light Chain 24.2 (H) 3.3 - 19.4 mg/L   Ig Lambda Free Light Chain 18.9 5.7 - 26.3 mg/L   KAPPA/LAMBDA RATIO 1.28 0.26 - 1.65  Tryptase     Status: None   Collection Time: 01/20/20  8:46 AM  Result Value Ref Range   Tryptase 4.9 2.2 - 13.2 ug/L  Immunofixation (IFE), Serum     Status: Abnormal   Collection Time: 01/20/20  9:31 AM  Result Value Ref Range   Immunofixation Result, Serum Comment (A)     Comment: Polyclonal increase detected in one or more immunoglobulins.   IgG (Immunoglobin G), Serum 1,465 586 - 1,602 mg/dL   IgA/Immunoglobulin A, Serum 411 (H) 87 - 352 mg/dL   IgM (Immunoglobulin M), Srm 74 26 - 217 mg/dL    RADIOGRAPHIC STUDIES: I have personally reviewed the radiological images as listed and agreed with the findings in the report. CT Abdomen Pelvis W Contrast  Result Date: 01/22/2020 CLINICAL DATA:  Acute lower abdominal pain. EXAM: CT ABDOMEN AND PELVIS WITH CONTRAST TECHNIQUE: Multidetector CT imaging of the abdomen and pelvis was performed using the standard protocol following bolus administration of intravenous contrast. CONTRAST:  166m OMNIPAQUE IOHEXOL 300 MG/ML  SOLN COMPARISON:  September 02, 2016. FINDINGS: Lower chest: No acute abnormality. Hepatobiliary: No focal liver abnormality is seen. Status post cholecystectomy. No biliary dilatation. Pancreas: Unremarkable. No pancreatic ductal dilatation or surrounding inflammatory changes. Spleen: Normal in size without focal abnormality. Adrenals/Urinary Tract: Adrenal glands appear normal. Stable small angiomyolipoma seen in midpole of left kidney. No hydronephrosis or renal obstruction is noted. No renal or ureteral calculi are noted. Urinary bladder is unremarkable. Stomach/Bowel: Stomach appears normal. There is no  evidence of bowel obstruction. Sigmoid diverticulosis is noted with increased inflammatory changes around the sigmoid colon concerning for mild sigmoid diverticulitis. No abscess is noted. Vascular/Lymphatic: No significant vascular findings are present. No enlarged abdominal or pelvic lymph nodes. Reproductive: Uterus and bilateral adnexa are unremarkable. Other: No abdominal wall hernia or abnormality. No abdominopelvic ascites. Musculoskeletal: No acute or significant osseous findings. IMPRESSION: 1. Sigmoid diverticulosis is  noted with increased inflammatory changes around the sigmoid colon concerning for mild sigmoid diverticulitis. No abscess is noted. 2. Stable small angiomyolipoma seen in midpole of left kidney. Electronically Signed   By: Marijo Conception M.D.   On: 01/22/2020 14:03    ASSESSMENT:  1. Elevated free kappa light chains: -Patient had recent diverticulitis in the first week of July. -Labs on 01/07/2020 shows total protein elevated at 8.4. -SPEP on 01/20/2020 shows 1.5 g/dL in the beta region with no M spike observed. Immunofixation was polyclonal. Free kappa light chains were elevated at 24.2 and lambda light chains 18.9 with ratio 1.28. Calcium was 9.5 and creatinine of 0.78. Hemoglobin was normal. -She does not have any B symptoms, new bone pains. No prior history of blood transfusions. No connective tissue disorders.  2. Family history: -Mother had lupus. -Mother also had lung cancer, underwent surgical resection. She was a non-smoker.   PLAN:  1. Elevated free kappa light chains: -I have reviewed the lab results with the patient in detail. -I do not believe she has any plasma cell disorder at this time. I think the elevated beta region on SPEP is secondary to increased immunoglobulins. I think the kappa light chains are elevated for the same reason. -I have recommended repeating labs in 4 months. If they come back to normal, we will discharge her from the clinic.   All  questions were answered. The patient knows to call the clinic with any problems, questions or concerns.   Derek Jack, MD 01/28/20 10:00 AM  [place] [phone]   I, Milinda Antis, am acting as a scribe for Dr. Sanda Linger.  I, Derek Jack MD, have reviewed the above documentation for accuracy and completeness, and I agree with the above.

## 2020-01-28 NOTE — Patient Instructions (Signed)
[place] at [facility] Discharge Instructions  You were seen today by Dr. Delton Coombes. He went over your recent results. The most likely cause of your increase in protein is due to your ongoing diverticulitis. Dr. Delton Coombes will see you back in 4 months for labs and follow up.   Thank you for choosing [facility] at [facility] to provide your oncology and hematology care.  To afford each patient quality time with our provider, please arrive at least 15 minutes before your scheduled appointment time.   If you have a lab appointment with the [facility] please come in thru the Main Entrance and check in at the main information desk  You need to re-schedule your appointment should you arrive 10 or more minutes late.  We strive to give you quality time with our providers, and arriving late affects you and other patients whose appointments are after yours.  Also, if you no show three or more times for appointments you may be dismissed from the clinic at the providers discretion.     Again, thank you for choosing [facility].  Our hope is that these requests will decrease the amount of time that you wait before being seen by our physicians.       _____________________________________________________________  Should you have questions after your visit to [facility], please contact our office at [phone] between the hours of 8:00 a.m. and 4:30 p.m.  Voicemails left after 4:00 p.m. will not be returned until the following business day.  For prescription refill requests, have your pharmacy contact our office and allow 72 hours.    Cancer Center Support Programs:   > Cancer Support Group  2nd Tuesday of the month 1pm-2pm, Journey Room

## 2020-01-30 ENCOUNTER — Telehealth: Payer: Self-pay | Admitting: Family Medicine

## 2020-01-30 NOTE — Telephone Encounter (Signed)
Pt would like to know if she needs to keep follow up tomorrow. States she has not had any abd pain since yesterday and finished the antibiotic over the weekend. She is feeling much better.  She would also like Dr. Nicki Reaper to look at the lab work from the specialist they have put in comments on it.

## 2020-01-30 NOTE — Telephone Encounter (Signed)
Patient notified and cancelled appt tomorrow with Dr Nicki Reaper. Patient will repeat blood work via specialist in 4 months

## 2020-01-30 NOTE — Telephone Encounter (Signed)
It would be fine to cancel the appointment for tomorrow  I did read over the specialist note I agree with rechecking the labs again in 4 months time and if they look good then that is very reassuring there is no additional testing would be warranted

## 2020-01-31 ENCOUNTER — Ambulatory Visit: Payer: No Typology Code available for payment source | Admitting: Family Medicine

## 2020-02-17 ENCOUNTER — Ambulatory Visit (INDEPENDENT_AMBULATORY_CARE_PROVIDER_SITE_OTHER): Payer: No Typology Code available for payment source | Admitting: Gastroenterology

## 2020-02-18 ENCOUNTER — Ambulatory Visit (INDEPENDENT_AMBULATORY_CARE_PROVIDER_SITE_OTHER): Payer: No Typology Code available for payment source | Admitting: Family Medicine

## 2020-02-18 DIAGNOSIS — U071 COVID-19: Secondary | ICD-10-CM | POA: Diagnosis not present

## 2020-02-18 DIAGNOSIS — R0981 Nasal congestion: Secondary | ICD-10-CM

## 2020-02-18 MED ORDER — MUPIROCIN 2 % EX OINT: 1.0000 "application " | TOPICAL_OINTMENT | Freq: Every day | CUTANEOUS | 0 refills | Status: DC

## 2020-02-18 MED ORDER — ONDANSETRON 8 MG PO TBDP: 8.0000 mg | ORAL_TABLET | Freq: Three times a day (TID) | ORAL | 2 refills | Status: DC | PRN

## 2020-02-18 NOTE — Progress Notes (Signed)
   Subjective:    Patient ID: Anita Shea, female    DOB: [birth year], 59 y.o.   MRN: [id number]  Sinusitis This is a new problem. The current episode started today. There has been no fever. Associated symptoms include congestion, headaches and sinus pressure.   Patient relates a lot of sinus pressure pain discomfort frontal headache she was exposed to Covid.  Her husband has Covid symptoms.  Patient is vaccinated   Review of Systems  HENT: Positive for congestion and sinus pressure.   Neurological: Positive for headaches.   Denies any wheezing difficulty breathing    Objective:   Physical Exam  O2 saturation 98% lungs clear heart regular HEENT benign Warning signs were discussed in detail     Assessment & Plan:  Viral syndrome Covid test taken Supportive measures discussed Follow-up if ongoing troubles

## 2020-02-20 ENCOUNTER — Telehealth: Payer: Self-pay | Admitting: Family Medicine

## 2020-02-20 NOTE — Telephone Encounter (Signed)
Patient coming for courtesy check as a car visit. She will call when she gets here at approximately 930 or 1030 on Friday morning. Please notify the nurse that patient is present so that the nurse will let me know so I will go out and take a listen to the lungs thank you

## 2020-02-21 ENCOUNTER — Ambulatory Visit: Payer: No Typology Code available for payment source | Admitting: Family Medicine

## 2020-02-21 LAB — NOVEL CORONAVIRUS, NAA: SARS-CoV-2, NAA: DETECTED — AB

## 2020-02-24 ENCOUNTER — Encounter: Payer: Self-pay | Admitting: Family Medicine

## 2020-02-24 MED ORDER — AMOXICILLIN 500 MG PO CAPS
500.0000 mg | ORAL_CAPSULE | Freq: Three times a day (TID) | ORAL | 0 refills | Status: DC
Start: 2020-02-24 — End: 2020-03-19

## 2020-02-24 NOTE — Addendum Note (Signed)
Addended by: Dairl Ponder on: 02/24/2020 01:14 PM   Modules accepted: Orders

## 2020-02-24 NOTE — Telephone Encounter (Signed)
Nurses Please talk with the patient Make sure she is not having any breathing difficulties in her chest It would be reasonable to treat with 1 round of antibiotics for the possibility of secondary sinus infection. Amoxil 500 mg 1 taken 3 times daily for 10 days Obviously if any type of respiratory issues in the chest please let me know

## 2020-02-27 DIAGNOSIS — Z029 Encounter for administrative examinations, unspecified: Secondary | ICD-10-CM

## 2020-03-04 ENCOUNTER — Encounter: Payer: Self-pay | Admitting: Family Medicine

## 2020-03-18 ENCOUNTER — Other Ambulatory Visit: Payer: Self-pay | Admitting: Family Medicine

## 2020-03-19 ENCOUNTER — Other Ambulatory Visit: Payer: Self-pay

## 2020-03-19 ENCOUNTER — Ambulatory Visit (INDEPENDENT_AMBULATORY_CARE_PROVIDER_SITE_OTHER): Payer: No Typology Code available for payment source | Admitting: Gastroenterology

## 2020-03-19 ENCOUNTER — Encounter (INDEPENDENT_AMBULATORY_CARE_PROVIDER_SITE_OTHER): Payer: Self-pay | Admitting: Gastroenterology

## 2020-03-19 DIAGNOSIS — K219 Gastro-esophageal reflux disease without esophagitis: Secondary | ICD-10-CM | POA: Diagnosis not present

## 2020-03-19 DIAGNOSIS — Z8719 Personal history of other diseases of the digestive system: Secondary | ICD-10-CM | POA: Diagnosis not present

## 2020-03-19 MED ORDER — ESOMEPRAZOLE MAGNESIUM 40 MG PO CPDR: 40.0000 mg | DELAYED_RELEASE_CAPSULE | Freq: Every day | ORAL | 5 refills | Status: DC

## 2020-03-19 NOTE — Patient Instructions (Addendum)
Start Nexium 40 mg qday Explained presumed etiology of reflux symptoms. Instruction provided in the use of antireflux medication - patient should take medication in the morning 30-45 minutes before eating breakfast. Discussed avoidance of eating within 2 hours of lying down to sleep and benefit of blocks to elevate head of bed. Can liberalize diet Stop using NSAIDs such as Aleve, ibuprofen, naproxen, Motrin, Voltaren or Advil (even the topical ones)

## 2020-03-19 NOTE — Progress Notes (Signed)
Maylon Peppers, M.D. Gastroenterology & Hepatology [facility] [address] [place], [place] [address] Primary Care Physician: Kathyrn Drown, MD [place] [id number]  Referring MD: PCP  I will communicate my assessment and recommendations to the referring MD via EMR. Note: Occasional unusual wording and randomly placed punctuation marks may result from the use of speech recognition technology to transcribe this document"  Chief Complaint: Episode of diverticulitis and heartburn  History of Present Illness: Anita Shea is a 59 y.o. female with past medical history of previous diverticulitis x2, GERD, hypertension, who presents for evaluation of recent acute diverticulitis and heartburn.  The patient states that back in 2018 she had an episode of acute diverticulitis for which she was treated with antibiotics.  Disease, she underwent proceed with colonoscopy with Dr. Laural Golden, was found to have a small polyp in the ascending colon which was removed, terminal ileum looked normal and she had presence of diverticulosis in the sigmoid colon but no other abnormalities.  Pathology came back positive for a tubular adenoma.  Since then, the patient states that she has tried to avoid "any kind of food that could potentially lead to another episodes of diverticulitis".  She reported she is currently avoiding foods that have seeds, red meat or large amount of fat.  She has also been taking different kinds of multivitamins.  The patient has been concerned as recently she developed a new episode of acute diverticulitis (confirmed with a CT of the abdomen and pelvis with IV contrast on 01/22/2020) for which she had to receive a course of Augmentin.  States that since then her symptoms have completely resolved and she denies having any complaints at the moment.  She is concerned as she would like to avoid any further episode of  diverticulitis.  States that she is having regular bowel movements without diarrhea or blood in her stool.  Has not presented any recent nausea, vomiting, fever, chills, hematochezia, melena, hematemesis, diarrhea, jaundice, pruritus or weight loss.  Patient reports that she has been taking a probiotic to avoid further episodes of diverticulitis.  She has been trying to eat vegetables and green food since her first episode of diverticulitis.  The patient states that she has a longstanding history of heartburn for the last 20 years.  She reports that she used to take Zantac 300 mg once a day which completely controlled her symptoms but her symptoms recurred when the medication was stopped as it was taken out of the market.  Since then, she reports having daily symptoms of heartburn despite taking omeprazole 20 mg at night, sometimes she takes Tums and Gas-X when she goes to sleep to improve her symptoms but this is not achieved completely.  States that she takes the omeprazole usually at night.  Denies having any odynophagia or dysphagia.  FHx: neg for any gastrointestinal/liver disease, no malignancies Social: neg smoking, alcohol or illicit drug use Surgical: non contributory  Past Medical History: Past Medical History:  Diagnosis Date  . Complication of anesthesia   . Diverticulitis of colon 08/30/2016  . Hypertension     Past Surgical History: Past Surgical History:  Procedure Laterality Date  . CHOLECYSTECTOMY     1993, flares  . COLONOSCOPY N/A 03/08/2017   Procedure: COLONOSCOPY;  Surgeon: Rogene Houston, MD;  [facility]: [facility];  Service: Endoscopy;  Laterality: N/A;  1200-rescheduled to 9/12 @ 1:00  . POLYPECTOMY  03/08/2017  Procedure: POLYPECTOMY;  Surgeon: Rogene Houston, MD;  Location: [facility];  Service: Endoscopy;;  . WISDOM TOOTH EXTRACTION      Family History: Family History  Problem Relation Age of Onset  . Stroke Mother   . Lung cancer Mother   . Heart  disease Father   . Heart attack Father   . Healthy Other   . Healthy Other   . Diabetes Brother   . Heart attack Brother   . Hypertension Sister   . Heart attack Maternal Grandfather   . Heart attack Paternal Grandfather   . Healthy Daughter     Social History: Social History   Tobacco Use  Smoking Status Never Smoker  Smokeless Tobacco Never Used   Social History   Substance and Sexual Activity  Alcohol Use No   Social History   Substance and Sexual Activity  Drug Use No    Allergies: Allergies  Allergen Reactions  . Aspirin Anaphylaxis  . Lisinopril Cough  . Nsaids Hives    Medications: Current Outpatient Medications  Medication Sig Dispense Refill  . Calcium Citrate-Vitamin D (CITRACAL + D PO) Take 1-2 tablets by mouth 2 (two) times daily. Takes 2 in the AM then 1 in the PM    . cetirizine (ZYRTEC) 10 MG tablet Take 10 mg by mouth daily.    Marland Kitchen docusate sodium (COLACE) 100 MG capsule Take 2 capsules (200 mg total) by mouth daily. 60 capsule 0  . hydrochlorothiazide (HYDRODIURIL) 25 MG tablet TAKE 1 TABLET BY MOUTH ONCE DAILY. 30 tablet 0  . Multiple Vitamin (MULTIVITAMIN) tablet Take 1 tablet by mouth daily.    . mupirocin ointment (BACTROBAN) 2 % Apply 1 application topically daily. 22 g 0  . omeprazole (PRILOSEC) 20 MG capsule Take 1 capsule (20 mg total) by mouth daily. 30 capsule 3  . potassium chloride (KLOR-CON) 10 MEQ tablet TAKE 2 TABLETS IN THE MORNING AND 1 TABLET IN THE AFTERNOON. 90 tablet 0  . Probiotic Product (PROBIOTIC DAILY PO) Take 1 capsule by mouth daily. culturelle 20,000 billion one daily     . simethicone (MYLICON) 767 MG chewable tablet Chew 250 mg by mouth at bedtime.     No current facility-administered medications for this visit.    Review of Systems: GENERAL: negative for malaise, night sweats HEENT: No changes in hearing or vision, no nose bleeds or other nasal problems. NECK: Negative for lumps, goiter, pain and significant neck  swelling RESPIRATORY: Negative for cough, wheezing CARDIOVASCULAR: Negative for chest pain, leg swelling, palpitations, orthopnea GI: SEE HPI MUSCULOSKELETAL: Negative for joint pain or swelling, back pain, and muscle pain. SKIN: Negative for lesions, rash PSYCH: Negative for sleep disturbance, mood disorder and recent psychosocial stressors. HEMATOLOGY Negative for prolonged bleeding, bruising easily, and swollen nodes. ENDOCRINE: Negative for cold or heat intolerance, polyuria, polydipsia and goiter. NEURO: negative for tremor, gait imbalance, syncope and seizures. The remainder of the review of systems is noncontributory.   Physical Exam: BP 118/76 (BP Location: Right Arm, Patient Position: Sitting, Cuff Size: Normal)   Pulse 86   Temp 99.1 F (37.3 C) (Oral)   Ht 5' 7.75" (1.721 m)   Wt 188 lb 1.6 oz (85.3 kg)   LMP 09/18/2012   BMI 28.81 kg/m  GENERAL: The patient is AO x3, in no acute distress. HEENT: Head is normocephalic and atraumatic. EOMI are intact. Mouth is well hydrated and without lesions. NECK: Supple. No masses LUNGS: Clear to auscultation. No presence of rhonchi/wheezing/rales. Adequate  chest expansion HEART: RRR, normal s1 and s2. ABDOMEN: Soft, nontender, no guarding, no peritoneal signs, and nondistended. BS +. No masses. EXTREMITIES: Without any cyanosis, clubbing, rash, lesions or edema. NEUROLOGIC: AOx3, no focal motor deficit. SKIN: no jaundice, no rashes   Imaging/Labs: as above  I personally reviewed and interpreted the available labs, imaging and endoscopic files.  Impression and Plan: Anita Shea is a 59 y.o. female with past medical history of previous diverticulitis x2, GERD, hypertension, who presents for evaluation of recent acute diverticulitis and heartburn.  The patient has had 2 episodes of uncomplicated acute diverticulitis in the past which have improved with the use of antibiotics.  She is currently asymptomatic from this disorder  and no further management is warranted at this point as she had a colonoscopy less than 3 years ago which rule out any malignancies.  Explained that the only possible intervention she she could do to avoid any more episodes diverticulitis could be avoiding NSAIDs which she is not taking as she is allergic to aspirin.  Otherwise, I advised her that she could have any kind of food that there are no restrictions such as seeds or fiber, as these are not associated with any of her episodes of diverticulitis.  I explained to her that the only definitive solution is to perform surgery but she has had very few episodes of diverticulitis and the morbidity related to the surgery far exceeds the benefits from it at this point.  She understood and agreed.  On the other hand, the patient is presenting persistent episodes of heartburn despite taking low-dose PPI.  I explained to her that the best way to take a PPI will be the morning fasting and waiting 30 minutes to have breakfast, a trial with Nexium will be started today.  If this fails, patient can consider taking the new Zantac presentation.  The patient understood and agreed.  We will consider performing an EGD at that point if GERD is refractory..  - Start Nexium 40 mg qday - Explained presumed etiology of reflux symptoms. Instruction provided in the use of antireflux medication - patient should take medication in the morning 30-45 minutes before eating breakfast. Discussed avoidance of eating within 2 hours of lying down to sleep and benefit of blocks to elevate head of bed. - Can liberalize diet - Stop using NSAIDs such as Aleve, ibuprofen, naproxen, Motrin, Voltaren or Advil (even the topical ones)  All questions were answered.      Maylon Peppers, MD Gastroenterology and Hepatology [facility]

## 2020-03-26 ENCOUNTER — Encounter (HOSPITAL_COMMUNITY): Payer: Self-pay

## 2020-03-30 ENCOUNTER — Encounter (HOSPITAL_COMMUNITY): Payer: Self-pay | Admitting: *Deleted

## 2020-03-30 ENCOUNTER — Telehealth (HOSPITAL_COMMUNITY): Payer: Self-pay | Admitting: *Deleted

## 2020-03-30 NOTE — Telephone Encounter (Signed)
See patient my chart message.

## 2020-04-20 ENCOUNTER — Other Ambulatory Visit: Payer: Self-pay | Admitting: Family Medicine

## 2020-05-19 ENCOUNTER — Other Ambulatory Visit: Payer: Self-pay | Admitting: Family Medicine

## 2020-05-20 NOTE — Telephone Encounter (Signed)
Please schedule visit and then route back to nurses

## 2020-05-22 ENCOUNTER — Other Ambulatory Visit: Payer: Self-pay | Admitting: Family Medicine

## 2020-05-25 ENCOUNTER — Encounter (HOSPITAL_COMMUNITY): Payer: Self-pay | Admitting: Oncology

## 2020-05-25 NOTE — Telephone Encounter (Signed)
Sent my chart message schedule appointment 

## 2020-05-26 MED ORDER — POTASSIUM CHLORIDE ER 10 MEQ PO TBCR: EXTENDED_RELEASE_TABLET | ORAL | 0 refills | Status: DC

## 2020-05-26 MED ORDER — HYDROCHLOROTHIAZIDE 25 MG PO TABS: 25.0000 mg | ORAL_TABLET | Freq: Every day | ORAL | 0 refills | Status: DC

## 2020-05-26 NOTE — Telephone Encounter (Signed)
Nurses Please give Anita Shea 90-day prescription of HCTZ and potassium with 1 refill Follow-up office visit in the springtime with lab work would be recommended Thanks-Dr. Nicki Reaper

## 2020-05-26 NOTE — Addendum Note (Signed)
Addended by: Vicente Males on: 05/26/2020 01:51 PM   Modules accepted: Orders

## 2020-05-28 ENCOUNTER — Inpatient Hospital Stay (HOSPITAL_COMMUNITY): Payer: No Typology Code available for payment source | Attending: Hematology

## 2020-05-28 ENCOUNTER — Other Ambulatory Visit: Payer: Self-pay

## 2020-05-28 DIAGNOSIS — R768 Other specified abnormal immunological findings in serum: Secondary | ICD-10-CM | POA: Insufficient documentation

## 2020-05-28 DIAGNOSIS — R778 Other specified abnormalities of plasma proteins: Secondary | ICD-10-CM | POA: Insufficient documentation

## 2020-05-28 LAB — PROTEIN, TOTAL: Total Protein: 8.4 g/dL — ABNORMAL HIGH (ref 6.5–8.1)

## 2020-05-29 ENCOUNTER — Encounter (HOSPITAL_COMMUNITY): Payer: Self-pay

## 2020-05-29 LAB — PROTEIN ELECTROPHORESIS, SERUM
A/G Ratio: 1 (ref 0.7–1.7)
Albumin ELP: 3.9 g/dL (ref 2.9–4.4)
Alpha-1-Globulin: 0.2 g/dL (ref 0.0–0.4)
Alpha-2-Globulin: 0.8 g/dL (ref 0.4–1.0)
Beta Globulin: 1.4 g/dL — ABNORMAL HIGH (ref 0.7–1.3)
Gamma Globulin: 1.5 g/dL (ref 0.4–1.8)
Globulin, Total: 3.9 g/dL (ref 2.2–3.9)
Total Protein ELP: 7.8 g/dL (ref 6.0–8.5)

## 2020-05-29 LAB — KAPPA/LAMBDA LIGHT CHAINS
Kappa free light chain: 23.3 mg/L — ABNORMAL HIGH (ref 3.3–19.4)
Kappa, lambda light chain ratio: 1.64 (ref 0.26–1.65)
Lambda free light chains: 14.2 mg/L (ref 5.7–26.3)

## 2020-06-01 LAB — IMMUNOFIXATION ELECTROPHORESIS
IgA: 383 mg/dL — ABNORMAL HIGH (ref 87–352)
IgG (Immunoglobin G), Serum: 1434 mg/dL (ref 586–1602)
IgM (Immunoglobulin M), Srm: 69 mg/dL (ref 26–217)
Total Protein ELP: 7.8 g/dL (ref 6.0–8.5)

## 2020-06-03 ENCOUNTER — Encounter (HOSPITAL_COMMUNITY): Payer: Self-pay

## 2020-06-03 NOTE — Telephone Encounter (Signed)
Called and left message and left two my chart messages to schedule follwup

## 2020-06-04 ENCOUNTER — Ambulatory Visit (HOSPITAL_COMMUNITY): Payer: No Typology Code available for payment source | Admitting: Oncology

## 2020-07-22 ENCOUNTER — Other Ambulatory Visit: Payer: Self-pay | Admitting: Family Medicine

## 2020-08-17 ENCOUNTER — Other Ambulatory Visit: Payer: Self-pay | Admitting: Family Medicine

## 2020-08-18 ENCOUNTER — Encounter: Payer: Self-pay | Admitting: Family Medicine

## 2020-08-18 ENCOUNTER — Other Ambulatory Visit: Payer: Self-pay

## 2020-08-18 ENCOUNTER — Ambulatory Visit (INDEPENDENT_AMBULATORY_CARE_PROVIDER_SITE_OTHER): Payer: BC Managed Care – PPO | Admitting: Family Medicine

## 2020-08-18 VITALS — BP 122/78 | HR 91 | Temp 97.9°F | Ht 67.75 in | Wt 180.0 lb

## 2020-08-18 DIAGNOSIS — I10 Essential (primary) hypertension: Secondary | ICD-10-CM

## 2020-08-18 MED ORDER — HYDROCHLOROTHIAZIDE 25 MG PO TABS: 25.0000 mg | ORAL_TABLET | Freq: Every day | ORAL | 11 refills | Status: DC

## 2020-08-18 NOTE — Patient Instructions (Signed)

## 2020-08-18 NOTE — Progress Notes (Signed)
Patient ID: Anita Shea, female    DOB: 1961/06/27, 60 y.o.   MRN: [id number]                                                     No chief complaint on file.  Subjective:  CC: medication management for HTN  This is not a new problem.  Presents today for medication management for hypertension.  Denies any side effects of medication, reports compliance takes daily as prescribed.  Denies fever, chills, chest pain, shortness of breath, leg swelling, heart palpitations.  Blood pressure well controlled.  Last labs done January 20, 2020 kidney function and liver function normal.  Reports that she had lab work done yesterday through her employer, she will get these results to be scanned into her medical record at this office.   bp check up.  Has a spot above left eye.    Medical History Anita Shea has a past medical history of Complication of anesthesia, Diverticulitis of colon (08/30/2016), and Hypertension.   Outpatient Encounter Medications as of 08/18/2020  Medication Sig  . calcium carbonate (TUMS EX) 750 MG chewable tablet Chew 2 tablets by mouth daily.  . Calcium Citrate-Vitamin D (CITRACAL + D PO) Take 1-2 tablets by mouth 2 (two) times daily. Takes 2 in the AM then 1 in the PM  . cetirizine (ZYRTEC) 10 MG tablet Take 10 mg by mouth daily.  Marland Kitchen docusate sodium (COLACE) 100 MG capsule Take 2 capsules (200 mg total) by mouth daily.  . Multiple Vitamin (MULTIVITAMIN) tablet Take 1 tablet by mouth daily.  . Omeprazole Magnesium (PRILOSEC OTC PO) Take by mouth.  . potassium chloride (KLOR-CON) 10 MEQ tablet TAKE 2 TABLETS IN THE MORNING AND 1 TABLET IN THE AFTERNOON.  . simethicone (MYLICON) 924 MG chewable tablet Chew 250 mg by mouth at bedtime.  . [DISCONTINUED] esomeprazole (NEXIUM) 40 MG capsule Take 1 capsule (40 mg total) by mouth daily before breakfast.  . [DISCONTINUED] hydrochlorothiazide (HYDRODIURIL) 25 MG tablet TAKE 1 TABLET BY MOUTH ONCE DAILY.  . [DISCONTINUED] Probiotic Product  (PROBIOTIC DAILY PO) Take 1 capsule by mouth daily. culturelle 20,000 billion one daily  . hydrochlorothiazide (HYDRODIURIL) 25 MG tablet Take 1 tablet (25 mg total) by mouth daily.  . [DISCONTINUED] mupirocin ointment (BACTROBAN) 2 % Apply 1 application topically daily. (Patient not taking: Reported on 03/19/2020)   No facility-administered encounter medications on file as of 08/18/2020.     Review of Systems  Constitutional: Negative for chills, fatigue and fever.  Respiratory: Negative for chest tightness and shortness of breath.   Cardiovascular: Negative for chest pain, palpitations and leg swelling.  Gastrointestinal: Negative for abdominal pain.  Neurological: Negative for dizziness, light-headedness and headaches.     Vitals BP 122/78   Pulse 91   Temp 97.9 F (36.6 C)   Ht 5' 7.75" (1.721 m)   Wt 180 lb (81.6 kg)   LMP 09/18/2012   SpO2 96%   BMI 27.57 kg/m   Objective:   Physical Exam Vitals reviewed.  Constitutional:      Appearance: Normal appearance.  Eyes:     Comments: Possible fatty deposit noted at left eyebrow.  Cardiovascular:     Rate and Rhythm: Normal rate and regular rhythm.     Heart sounds: Normal heart sounds.  Pulmonary:  Effort: Pulmonary effort is normal.     Breath sounds: Normal breath sounds.  Skin:    General: Skin is warm and dry.  Neurological:     General: No focal deficit present.     Mental Status: She is alert.  Psychiatric:        Behavior: Behavior normal.      Assessment and Plan   1. Essential hypertension, benign - hydrochlorothiazide (HYDRODIURIL) 25 MG tablet; Take 1 tablet (25 mg total) by mouth daily.  Dispense: 30 tablet; Refill: 11   Blood pressure well controlled.  Hypertension Medication compliance: taking as prescribed Denies chest pain, shortness of breath, lower extremity edema, vision changes, headaches.  Pertinent lab work: last labs 01-20-20 (labs with life insurance policy 9/37/34)- will get  results for this office ASCVD 10- year risk score: do not have recent LDL.  Monitoring: every 12 months Side effects:  none Continue current medication regimen: continue HCTZ 25 mg daily.   Lifestyle discussed, healthy BMI discussed.  Reports walking 2 miles daily.  Encourage continued healthy lifestyle, sodium restriction, exercise.  Recommend annual wellness with Pap.   Pecolia Ades, NP 08/18/2020

## 2020-11-10 ENCOUNTER — Encounter (INDEPENDENT_AMBULATORY_CARE_PROVIDER_SITE_OTHER): Payer: Self-pay

## 2020-11-11 ENCOUNTER — Other Ambulatory Visit: Payer: Self-pay

## 2020-11-11 ENCOUNTER — Ambulatory Visit: Payer: BC Managed Care – PPO | Admitting: Family Medicine

## 2020-11-11 ENCOUNTER — Telehealth: Payer: Self-pay

## 2020-11-11 VITALS — BP 128/76 | HR 84 | Temp 97.3°F | Ht 67.75 in | Wt 171.0 lb

## 2020-11-11 DIAGNOSIS — K5792 Diverticulitis of intestine, part unspecified, without perforation or abscess without bleeding: Secondary | ICD-10-CM

## 2020-11-11 DIAGNOSIS — R109 Unspecified abdominal pain: Secondary | ICD-10-CM

## 2020-11-11 MED ORDER — ONDANSETRON HCL 8 MG PO TABS: 8.0000 mg | ORAL_TABLET | Freq: Three times a day (TID) | ORAL | 1 refills | Status: DC | PRN

## 2020-11-11 MED ORDER — AMOXICILLIN-POT CLAVULANATE 875-125 MG PO TABS: 1.0000 | ORAL_TABLET | Freq: Two times a day (BID) | ORAL | 0 refills | Status: DC

## 2020-11-11 MED ORDER — ONDANSETRON HCL 8 MG PO TABS
8.0000 mg | ORAL_TABLET | Freq: Three times a day (TID) | ORAL | 1 refills | Status: DC | PRN
Start: 2020-11-11 — End: 2020-11-11

## 2020-11-11 NOTE — Telephone Encounter (Signed)
Confirmed with patient and resent today's prescriptions to [facility] . [facility].

## 2020-11-11 NOTE — Telephone Encounter (Signed)
Anita Shea called back and said her medications were going to be 120.00 at [facility] or 20.00 at [facility]. She wants to know if we will switch both meds to [facility] in [place].

## 2020-11-11 NOTE — Progress Notes (Signed)
   Subjective:    Patient ID: Anita Shea, female    DOB: 12-Jan-1961, 60 y.o.   MRN: [id number]  HPI  Significant lower abdominal pain mid abdomen comes and goes since Friday night.  Had some loose stool over the weekend No vomiting Has had diverticulitis in the past Has also been doing a lot of exercise recently including abdominal exercises wonders if it could be related to that  Low abdominal pain comes and goes since Friday night  Had diarrhea one Friday night  Liquid diet 12 hrs on Monday, moved to white bread, jello    Review of Systems     Objective:   Physical Exam  Lungs clear respiratory rate normal heart regular pulse normal abdomen is soft There is some tenderness in the lower mid region of the abdomen and some tenderness in the left lower quadrant     Assessment & Plan:  More than likely this is diverticulitis.  Cannot totally exclude other causes at this point but I believe it is reasonable to go ahead and start Augmentin twice daily She does not tolerate Cipro and Flagyl Hold off on any blood work or CAT scan currently Does not have surgical abdomen If progressive troubles or worse to notify us If this becomes repetitive referral to general surgery for potential resection of offending area Recheck again in 1 week's time sooner if any problems

## 2020-11-17 ENCOUNTER — Other Ambulatory Visit: Payer: Self-pay

## 2020-11-17 ENCOUNTER — Ambulatory Visit: Payer: BC Managed Care – PPO | Admitting: Family Medicine

## 2020-11-17 ENCOUNTER — Encounter: Payer: Self-pay | Admitting: Family Medicine

## 2020-11-17 VITALS — BP 118/76 | HR 95 | Temp 94.1°F | Wt 172.2 lb

## 2020-11-17 DIAGNOSIS — W57XXXA Bitten or stung by nonvenomous insect and other nonvenomous arthropods, initial encounter: Secondary | ICD-10-CM

## 2020-11-17 DIAGNOSIS — R109 Unspecified abdominal pain: Secondary | ICD-10-CM

## 2020-11-17 DIAGNOSIS — I10 Essential (primary) hypertension: Secondary | ICD-10-CM

## 2020-11-17 DIAGNOSIS — S30860A Insect bite (nonvenomous) of lower back and pelvis, initial encounter: Secondary | ICD-10-CM

## 2020-11-17 NOTE — Patient Instructions (Signed)
Tick Bite Information, Adult  Ticks are insects that can bite. Most ticks live in shrubs and grassy areas. They climb onto people and animals that go by. Then they bite. Some ticks carry germs that can make you sick. How can I prevent tick bites? Take these steps: Use insect repellent  Use an insect repellent that has 20% or higher of the ingredients DEET, picaridin, or IR3535. Follow the instructions on the label. Put it on: ? Bare skin. ? The tops of your boots. ? Your pant legs. ? The ends of your sleeves.  If you use an insect repellent that has the ingredient permethrin, follow the instructions on the label. Put it on: ? Clothing. ? Boots. ? Supplies or outdoor gear. ? Tents. When you are outside  Wear long sleeves and long pants.  Wear light-colored clothes.  Tuck your pant legs into your socks.  Stay in the middle of the trail. Do not touch the bushes.  Avoid walking through long grass.  Check for ticks on your clothes, hair, and skin often while you are outside. Before going inside your house, check your clothes, skin, head, neck, armpits, waist, groin, and joint areas. When you go indoors  Check your clothes for ticks. Dry your clothes in a dryer on high heat for 10 minutes or more. If clothes are damp, additional time may be needed.  Wash your clothes right away if they need to be washed. Use hot water.  Check your pets and outdoor gear.  Shower right away.  Check your body for ticks. Do a full body check using a mirror. What is the right way to remove a tick? Remove the tick from your skin as soon as possible. Do not remove the tick with your bare fingers.  To remove a tick that is crawling on your skin: ? Go outdoors and brush the tick off. ? Use tape or a lint roller.  To remove a tick that is biting: 1. Wash your hands. 2. If you have latex gloves, put them on. 3. Use tweezers, curved forceps, or a tick-removal tool to grasp the tick. Grasp the tick  as close to your skin and as close to the tick's head as possible. 4. Gently pull up until the tick lets go.  Try to keep the tick's head attached to its body.  Do not twist or jerk the tick.  Do not squeeze or crush the tick. Do not try to remove a tick with heat, alcohol, petroleum jelly, or fingernail polish.   What should I do after taking out a tick?  Throw away the tick. Do not crush a tick with your fingers.  Clean the bite area and your hands with soap and water, rubbing alcohol, or an iodine wash.  If an antiseptic cream or ointment is available, apply a small amount to the bite area.  Wash and disinfect any instruments that you used to remove the tick. How should I get rid of a live tick? To dispose of a live tick, use one of these methods:  Place the tick in rubbing alcohol.  Place the tick in a bag or container you can close tightly.  Wrap the tick tightly in tape.  Flush the tick down the toilet. Contact a doctor if:  You have symptoms, such as: ? A fever or chills. ? A red rash that makes a circle (bull's-eye rash) in the bite area. ? Redness and swelling where the tick bit you. ? Headache. ?  Pain in a muscle, joint, or bone. ? Being more tired than normal. ? Trouble walking or moving your legs. ? Numbness in your legs. ? Tender and swollen lymph glands.  A part of a tick breaks off and gets stuck in your skin. Get help right away if:  You cannot remove a tick.  You cannot move (have paralysis) or feel weak.  You are feeling worse or have new symptoms.  You find a tick that is biting you and filled with blood. This is important if you are in an area where diseases from ticks are common. Summary  Ticks may carry germs that can make you sick.  To prevent tick bites wear long sleeves, long pants, and light colors. Use insect repellent. Follow the instructions on the label.  If the tick is biting, do not try to remove it with heat, alcohol, petroleum  jelly, or fingernail polish.  Use tweezers, curved forceps, or a tick-removal tool to grasp the tick. Gently pull up until the tick lets go. Do not twist or jerk the tick. Do not squeeze or crush the tick.  If you have symptoms, contact a doctor. This information is not intended to replace advice given to you by your health care provider. Make sure you discuss any questions you have with your health care provider. Document Revised: 06/10/2019 Document Reviewed: 06/10/2019 Elsevier Patient Education  2021 [employer].

## 2020-11-17 NOTE — Progress Notes (Signed)
   Subjective:    Patient ID: Anita Shea, female    DOB: [birth year], 60 y.o.   MRN: [id number]  HPI Pt here for follow up on abdominal pain. Pt states things are much better. Still taking Augmentin. Already movement well.  Took the antibiotic it did help  Pt is directed to take 2 potassium tab in the morning and one in the afternoon but pt does not always take one in the afternoon; pt would like to discuss that with provider.  We did discuss how she may stick with just the morning dose   Pt had a tick bite in vaginal area- pt husband did get it out but pt wanted to let provider know.  This happened a couple days ago minimal redness no fevers or body aches Review of Systems     Objective:   Physical Exam  Abdomen soft no guarding rebound subjective discomfort lateral aspect none in the left lower quadrant lungs are clear respiratory rate normal heart regular pulse normal BP good      Assessment & Plan:  Patient will take her potassium just in the morning Continue the diuretic Follow-up by fall time with lab work and recheck Healthy diet regular physical activity recommended Patient doing a good job of this and losing weight Tick bite warning signs and what to watch for was discussed Diverticular disease I believe this is calm down no need for lab work or CT scan at this time

## 2020-11-18 ENCOUNTER — Ambulatory Visit: Payer: BC Managed Care – PPO | Admitting: Family Medicine

## 2021-01-19 ENCOUNTER — Other Ambulatory Visit: Payer: Self-pay | Admitting: Family Medicine

## 2021-02-22 ENCOUNTER — Other Ambulatory Visit: Payer: Self-pay

## 2021-02-22 ENCOUNTER — Encounter: Payer: Self-pay | Admitting: Family Medicine

## 2021-02-22 ENCOUNTER — Ambulatory Visit: Payer: BC Managed Care – PPO | Admitting: Family Medicine

## 2021-02-22 DIAGNOSIS — R519 Headache, unspecified: Secondary | ICD-10-CM

## 2021-02-22 DIAGNOSIS — J029 Acute pharyngitis, unspecified: Secondary | ICD-10-CM | POA: Diagnosis not present

## 2021-02-22 DIAGNOSIS — M542 Cervicalgia: Secondary | ICD-10-CM | POA: Diagnosis not present

## 2021-02-22 LAB — POCT RAPID STREP A (OFFICE): Rapid Strep A Screen: NEGATIVE

## 2021-02-22 MED ORDER — CYCLOBENZAPRINE HCL 5 MG PO TABS
ORAL_TABLET | ORAL | 0 refills | Status: DC
Start: 2021-02-22 — End: 2022-02-15

## 2021-02-22 NOTE — Progress Notes (Signed)
   Subjective:    Patient ID: Anita Shea, female    DOB: March 28, 1961, 60 y.o.   MRN: [id number]  HPI Pt having throat pain and back of neck pain that started at the same time. Faint headache all the way around that is always there. Pt does usually have headaches. No vision issues. No tingling.   No fever No nausea or vomiting Energy ok No runny nose Some sore trhoat Some neck pain with motion of the neck  Mild head symptoms Constant ache in the head No blurry vision Sx for 7 days  No hx tick bites  Front and back neck and head Tried tylenol  No numbness or tingling Review of Systems     Objective:   Physical Exam Throat no erythema neck is supple no masses lungs are clear no crackles heart regular       Assessment & Plan:  Neck discomfort Pharyngitis Mild dull headache Starting off with strep test and COVID test Strep test negative If COVID-negative then proceed forward with CMP, CBC, thyroid, CRP and sed rate  If that is negative consider MRI of brain and cervical spine it is possible the cervical thoracic syrinx is causing trouble

## 2021-02-23 LAB — NOVEL CORONAVIRUS, NAA: SARS-CoV-2, NAA: NOT DETECTED

## 2021-02-23 LAB — SARS-COV-2, NAA 2 DAY TAT

## 2021-02-24 ENCOUNTER — Other Ambulatory Visit: Payer: Self-pay | Admitting: Family Medicine

## 2021-02-24 ENCOUNTER — Encounter: Payer: Self-pay | Admitting: Family Medicine

## 2021-02-24 DIAGNOSIS — R5383 Other fatigue: Secondary | ICD-10-CM

## 2021-02-24 DIAGNOSIS — R519 Headache, unspecified: Secondary | ICD-10-CM

## 2021-02-24 DIAGNOSIS — Z79899 Other long term (current) drug therapy: Secondary | ICD-10-CM

## 2021-02-24 DIAGNOSIS — M542 Cervicalgia: Secondary | ICD-10-CM

## 2021-02-24 DIAGNOSIS — Z1329 Encounter for screening for other suspected endocrine disorder: Secondary | ICD-10-CM

## 2021-03-09 ENCOUNTER — Other Ambulatory Visit: Payer: Self-pay | Admitting: Family Medicine

## 2021-04-21 ENCOUNTER — Other Ambulatory Visit: Payer: Self-pay | Admitting: Family Medicine

## 2021-05-25 ENCOUNTER — Other Ambulatory Visit: Payer: Self-pay | Admitting: Family Medicine

## 2021-07-22 ENCOUNTER — Other Ambulatory Visit: Payer: Self-pay | Admitting: Family Medicine

## 2021-08-16 ENCOUNTER — Other Ambulatory Visit: Payer: Self-pay

## 2021-08-16 ENCOUNTER — Encounter: Payer: Self-pay | Admitting: Nurse Practitioner

## 2021-08-16 ENCOUNTER — Ambulatory Visit: Payer: BC Managed Care – PPO | Admitting: Nurse Practitioner

## 2021-08-16 VITALS — BP 140/80 | HR 95 | Temp 97.2°F | Wt 167.4 lb

## 2021-08-16 DIAGNOSIS — Z13220 Encounter for screening for lipoid disorders: Secondary | ICD-10-CM

## 2021-08-16 DIAGNOSIS — I10 Essential (primary) hypertension: Secondary | ICD-10-CM | POA: Diagnosis not present

## 2021-08-16 MED ORDER — HYDROCHLOROTHIAZIDE 25 MG PO TABS: 25.0000 mg | ORAL_TABLET | Freq: Every day | ORAL | 11 refills | Status: DC

## 2021-08-16 MED ORDER — POTASSIUM CHLORIDE ER 10 MEQ PO TBCR: EXTENDED_RELEASE_TABLET | ORAL | 0 refills | Status: DC

## 2021-08-16 NOTE — Progress Notes (Signed)
° °  Subjective:    Patient ID: Anita Shea, female    DOB: 01-Aug-1960, 61 y.o.   MRN: [id number]  HPI   Patient here for follow up on medication for blood pressure.  Patient states that her blood pressures have been running 120s over 70s at home.  Patient has been working hard on losing weight patient currently weighs 167 pounds with a goal of 165 pounds.  Patient currently using weight watchers to aid in weight loss.  Patient states that she is currently eating a plain protein-based diet which has helped her to lose weight.  Patient is wondering if she can do start weaning off her blood pressure medication.  Patient has no other concerns.  Review of Systems  All other systems reviewed and are negative.     Objective:   Physical Exam Vitals reviewed.  Constitutional:      General: She is not in acute distress.    Appearance: Normal appearance. She is normal weight. She is not ill-appearing or toxic-appearing.  Cardiovascular:     Rate and Rhythm: Normal rate and regular rhythm.     Pulses: Normal pulses.     Heart sounds: Normal heart sounds. No murmur heard. Pulmonary:     Effort: Pulmonary effort is normal. No respiratory distress.     Breath sounds: Normal breath sounds. No wheezing.  Skin:    General: Skin is warm.  Neurological:     General: No focal deficit present.     Mental Status: She is alert and oriented to person, place, and time.  Psychiatric:        Mood and Affect: Mood normal.        Behavior: Behavior normal.          Assessment & Plan:   1. Essential hypertension, benign -Blood pressure 140/82 today.  Recheck blood pressure was 140/80.  Goal for patient 140/90 met. -Through shared decision making, we decided that patient should stay on medication for the time being.  Continue to reach her goal of 165 pounds and we can reassess the necessity for blood pressure medication at that time if necessary. - CMP14+EGFR - hydrochlorothiazide (HYDRODIURIL) 25 MG  tablet; Take 1 tablet (25 mg total) by mouth daily.  Dispense: 30 tablet; Refill: 11 - potassium chloride (KLOR-CON) 10 MEQ tablet; TAKE 2 TABLETS IN THE MORNING AND 1 TABLET IN THE AFTERNOON.  Dispense: 90 tablet; Refill: 0 - Return to clinic in 3 months  2. Screening, lipid - Lipid Profile

## 2021-08-17 LAB — CMP14+EGFR
ALT: 11 IU/L (ref 0–32)
AST: 14 IU/L (ref 0–40)
Albumin/Globulin Ratio: 1.8 (ref 1.2–2.2)
Albumin: 5 g/dL — ABNORMAL HIGH (ref 3.8–4.9)
Alkaline Phosphatase: 84 IU/L (ref 44–121)
BUN/Creatinine Ratio: 21 (ref 9–23)
BUN: 15 mg/dL (ref 6–24)
Bilirubin Total: 0.3 mg/dL (ref 0.0–1.2)
CO2: 27 mmol/L (ref 20–29)
Calcium: 10.2 mg/dL (ref 8.7–10.2)
Chloride: 97 mmol/L (ref 96–106)
Creatinine, Ser: 0.73 mg/dL (ref 0.57–1.00)
Globulin, Total: 2.8 g/dL (ref 1.5–4.5)
Glucose: 92 mg/dL (ref 70–99)
Potassium: 4.2 mmol/L (ref 3.5–5.2)
Sodium: 138 mmol/L (ref 134–144)
Total Protein: 7.8 g/dL (ref 6.0–8.5)
eGFR: 95 mL/min/{1.73_m2} (ref >59)

## 2021-08-17 LAB — LIPID PANEL
Chol/HDL Ratio: 4.4 ratio (ref 0.0–4.4)
Cholesterol, Total: 208 mg/dL — ABNORMAL HIGH (ref 100–199)
HDL: 47 mg/dL (ref >39)
LDL Chol Calc (NIH): 117 mg/dL — ABNORMAL HIGH (ref 0–99)
Triglycerides: 253 mg/dL — ABNORMAL HIGH (ref 0–149)
VLDL Cholesterol Cal: 44 mg/dL — ABNORMAL HIGH (ref 5–40)

## 2021-08-17 NOTE — Progress Notes (Signed)
Hello Anita Shea!  Your cholesterol panel came back a little bit elevated.  I am aware that you were not fasting.  We will take a look at your cholesterol panel again in May when you see Dr. Nicki Reaper.  Otherwise keep doing what you are doing with your plant-based diet and decreased meat consumption and we will see what your cholesterol is doing in approximately 3 months.  Your CMP measures electrolytes, kidney function, and liver function.  I have no concerns with your CMP lab results.  Everything looks good.  We will see you in 3 months.  [name]

## 2021-08-18 ENCOUNTER — Telehealth: Payer: Self-pay | Admitting: Family Medicine

## 2021-08-18 ENCOUNTER — Other Ambulatory Visit: Payer: Self-pay

## 2021-08-18 ENCOUNTER — Encounter: Payer: Self-pay | Admitting: Family Medicine

## 2021-08-18 DIAGNOSIS — Z79899 Other long term (current) drug therapy: Secondary | ICD-10-CM

## 2021-08-18 DIAGNOSIS — E781 Pure hyperglyceridemia: Secondary | ICD-10-CM

## 2021-08-18 DIAGNOSIS — I10 Essential (primary) hypertension: Secondary | ICD-10-CM

## 2021-08-18 NOTE — Telephone Encounter (Signed)
Nurses Due to hyperlipidemia and HTN recommend Lipid, liver, metabolic 7 in 3 months patient already has follow-up visit Please put in the orders thank you

## 2021-09-04 ENCOUNTER — Other Ambulatory Visit: Payer: Self-pay | Admitting: Family Medicine

## 2021-09-04 DIAGNOSIS — I10 Essential (primary) hypertension: Secondary | ICD-10-CM

## 2021-09-27 ENCOUNTER — Encounter: Payer: Self-pay | Admitting: Family Medicine

## 2021-09-27 DIAGNOSIS — Z1321 Encounter for screening for nutritional disorder: Secondary | ICD-10-CM

## 2021-09-27 DIAGNOSIS — E538 Deficiency of other specified B group vitamins: Secondary | ICD-10-CM

## 2021-09-27 NOTE — Telephone Encounter (Signed)
Nurses ?Currently I believe that her WFPB is perfectly fine.  I do not feel that this is going to cause her diverticulitis. ? ?I would recommend adding a vitamin B12 level as well as a vitamin D level to the labs that were ordered back in February.  She should do these labs before her follow-up office visit perhaps 5 to 7 days before that visit in May. ? ?She may have a refill of Bentyl 20 mg, #30, 1 taken 3 times daily as needed for abdominal cramping, 2 refills ?

## 2021-09-28 MED ORDER — DICYCLOMINE HCL 20 MG PO TABS: ORAL_TABLET | ORAL | 2 refills | Status: DC

## 2021-10-20 ENCOUNTER — Other Ambulatory Visit: Payer: Self-pay | Admitting: Family Medicine

## 2021-10-20 DIAGNOSIS — I10 Essential (primary) hypertension: Secondary | ICD-10-CM

## 2021-11-08 ENCOUNTER — Encounter: Payer: Self-pay | Admitting: Family Medicine

## 2021-11-08 DIAGNOSIS — Z79899 Other long term (current) drug therapy: Secondary | ICD-10-CM

## 2021-11-08 DIAGNOSIS — I10 Essential (primary) hypertension: Secondary | ICD-10-CM

## 2021-11-08 DIAGNOSIS — E781 Pure hyperglyceridemia: Secondary | ICD-10-CM

## 2021-11-08 DIAGNOSIS — E538 Deficiency of other specified B group vitamins: Secondary | ICD-10-CM

## 2021-11-08 DIAGNOSIS — Z1321 Encounter for screening for nutritional disorder: Secondary | ICD-10-CM

## 2021-11-08 NOTE — Telephone Encounter (Signed)
Nurses-please order vitamin D, vitamin B12, lipid, liver, metabolic 7, CBC, TIBC, ferritin ?Iron deficient anemia, B12 deficiency, vitamin D deficiency, hyperlipidemia, diuretic use ?

## 2021-11-11 DIAGNOSIS — E781 Pure hyperglyceridemia: Secondary | ICD-10-CM | POA: Diagnosis not present

## 2021-11-11 DIAGNOSIS — E538 Deficiency of other specified B group vitamins: Secondary | ICD-10-CM | POA: Diagnosis not present

## 2021-11-11 DIAGNOSIS — Z1321 Encounter for screening for nutritional disorder: Secondary | ICD-10-CM | POA: Diagnosis not present

## 2021-11-11 DIAGNOSIS — I10 Essential (primary) hypertension: Secondary | ICD-10-CM | POA: Diagnosis not present

## 2021-11-11 DIAGNOSIS — Z79899 Other long term (current) drug therapy: Secondary | ICD-10-CM | POA: Diagnosis not present

## 2021-11-12 ENCOUNTER — Encounter: Payer: Self-pay | Admitting: Family Medicine

## 2021-11-12 LAB — CBC WITH DIFFERENTIAL/PLATELET
Basophils Absolute: 0.1 10*3/uL (ref 0.0–0.2)
Basos: 1 %
EOS (ABSOLUTE): 0.2 10*3/uL (ref 0.0–0.4)
Eos: 3 %
Hematocrit: 42.9 % (ref 34.0–46.6)
Hemoglobin: 14.6 g/dL (ref 11.1–15.9)
Immature Grans (Abs): 0 10*3/uL (ref 0.0–0.1)
Immature Granulocytes: 0 %
Lymphocytes Absolute: 2.3 10*3/uL (ref 0.7–3.1)
Lymphs: 37 %
MCH: 29.9 pg (ref 26.6–33.0)
MCHC: 34 g/dL (ref 31.5–35.7)
MCV: 88 fL (ref 79–97)
Monocytes Absolute: 0.5 10*3/uL (ref 0.1–0.9)
Monocytes: 7 %
Neutrophils Absolute: 3.3 10*3/uL (ref 1.4–7.0)
Neutrophils: 52 %
Platelets: 302 10*3/uL (ref 150–450)
RBC: 4.88 x10E6/uL (ref 3.77–5.28)
RDW: 12.4 % (ref 11.7–15.4)
WBC: 6.3 10*3/uL (ref 3.4–10.8)

## 2021-11-12 LAB — VITAMIN D 25 HYDROXY (VIT D DEFICIENCY, FRACTURES): Vit D, 25-Hydroxy: 33.5 ng/mL (ref 30.0–100.0)

## 2021-11-12 LAB — LIPID PANEL
Chol/HDL Ratio: 4.7 ratio — ABNORMAL HIGH (ref 0.0–4.4)
Cholesterol, Total: 224 mg/dL — ABNORMAL HIGH (ref 100–199)
HDL: 48 mg/dL (ref >39)
LDL Chol Calc (NIH): 144 mg/dL — ABNORMAL HIGH (ref 0–99)
Triglycerides: 177 mg/dL — ABNORMAL HIGH (ref 0–149)
VLDL Cholesterol Cal: 32 mg/dL (ref 5–40)

## 2021-11-12 LAB — HEPATIC FUNCTION PANEL
ALT: 10 IU/L (ref 0–32)
AST: 13 IU/L (ref 0–40)
Albumin: 5 g/dL — ABNORMAL HIGH (ref 3.8–4.9)
Alkaline Phosphatase: 88 IU/L (ref 44–121)
Bilirubin Total: 0.4 mg/dL (ref 0.0–1.2)
Bilirubin, Direct: <0.1 mg/dL (ref 0.00–0.40)
Total Protein: 8.3 g/dL (ref 6.0–8.5)

## 2021-11-12 LAB — BASIC METABOLIC PANEL
BUN/Creatinine Ratio: 20 (ref 12–28)
BUN: 16 mg/dL (ref 8–27)
CO2: 25 mmol/L (ref 20–29)
Calcium: 9.9 mg/dL (ref 8.7–10.3)
Chloride: 100 mmol/L (ref 96–106)
Creatinine, Ser: 0.8 mg/dL (ref 0.57–1.00)
Glucose: 91 mg/dL (ref 70–99)
Potassium: 4.3 mmol/L (ref 3.5–5.2)
Sodium: 141 mmol/L (ref 134–144)
eGFR: 84 mL/min/{1.73_m2} (ref >59)

## 2021-11-12 LAB — FERRITIN: Ferritin: 54 ng/mL (ref 15–150)

## 2021-11-12 LAB — IRON AND TIBC
Iron Saturation: 24 % (ref 15–55)
Iron: 83 ug/dL (ref 27–159)
Total Iron Binding Capacity: 350 ug/dL (ref 250–450)
UIBC: 267 ug/dL (ref 131–425)

## 2021-11-12 LAB — VITAMIN B12: Vitamin B-12: 1076 pg/mL (ref 232–1245)

## 2021-11-15 ENCOUNTER — Ambulatory Visit: Payer: BC Managed Care – PPO | Admitting: Family Medicine

## 2021-11-19 ENCOUNTER — Ambulatory Visit: Payer: BC Managed Care – PPO | Admitting: Family Medicine

## 2021-11-19 DIAGNOSIS — I10 Essential (primary) hypertension: Secondary | ICD-10-CM | POA: Diagnosis not present

## 2021-11-19 MED ORDER — POTASSIUM CHLORIDE ER 10 MEQ PO TBCR: EXTENDED_RELEASE_TABLET | ORAL | 12 refills | Status: DC

## 2021-11-19 NOTE — Patient Instructions (Signed)

## 2021-11-19 NOTE — Progress Notes (Unsigned)
   Subjective:    Patient ID: Charlestine Massed, female    DOB: 08/16/1960, 61 y.o.   MRN: [id number]  Hyperlipidemia This is a recurrent problem.  Discuss lab results  Has been on plant based diet for 100 days cholesterol is elevated  Dr Roland Earl Review of Systems The 10-year ASCVD risk score (Arnett DK, et al., 2019) is: 5%   Values used to calculate the score:     Age: 61 years     Sex: Female     Is Non-Hispanic African American: No     Diabetic: No     Tobacco smoker: No     Systolic Blood Pressure: 383 mmHg     Is BP treated: Yes     HDL Cholesterol: 48 mg/dL     Total Cholesterol: 224 mg/dL     Objective:   Physical Exam General-in no acute distress Eyes-no discharge Lungs-respiratory rate normal, CTA CV-no murmurs,RRR Extremities skin warm dry no edema Neuro grossly normal Behavior normal, alert        Assessment & Plan:  Blood pressure is still in the range that I would recommend continuing the medications Patient doing fantastic with weight loss Doing very good job with healthy diet Overall energy level doing well Cholesterol has gone up She will work hard on continued healthy diet Increasing oatmeal in the morning Continue her walking Follow-up for blood pressures

## 2021-12-07 ENCOUNTER — Other Ambulatory Visit: Payer: Self-pay | Admitting: *Deleted

## 2021-12-07 DIAGNOSIS — Z1231 Encounter for screening mammogram for malignant neoplasm of breast: Secondary | ICD-10-CM

## 2021-12-23 ENCOUNTER — Other Ambulatory Visit: Payer: Self-pay | Admitting: Nurse Practitioner

## 2021-12-23 DIAGNOSIS — I10 Essential (primary) hypertension: Secondary | ICD-10-CM

## 2022-02-07 ENCOUNTER — Ambulatory Visit (HOSPITAL_COMMUNITY)
Admission: RE | Admit: 2022-02-07 | Discharge: 2022-02-07 | Disposition: A | Discharge status: Home / Self Care | Payer: BC Managed Care – PPO | Source: Ambulatory Visit | Attending: Family Medicine | Admitting: Family Medicine

## 2022-02-07 DIAGNOSIS — Z1231 Encounter for screening mammogram for malignant neoplasm of breast: Secondary | ICD-10-CM | POA: Insufficient documentation

## 2022-02-15 ENCOUNTER — Other Ambulatory Visit: Payer: Self-pay | Admitting: Family Medicine

## 2022-02-15 ENCOUNTER — Encounter: Payer: Self-pay | Admitting: Family Medicine

## 2022-02-15 MED ORDER — CYCLOBENZAPRINE HCL 5 MG PO TABS
ORAL_TABLET | ORAL | 0 refills | Status: DC
Start: 2022-02-15 — End: 2022-03-07

## 2022-02-15 NOTE — Progress Notes (Signed)
Patient having intermittent neck pain and discomfort she request a refill on her muscle relaxer we will use this at nighttime if she has ongoing trouble she will need to do a follow-up visit

## 2022-02-16 ENCOUNTER — Ambulatory Visit (INDEPENDENT_AMBULATORY_CARE_PROVIDER_SITE_OTHER): Payer: BC Managed Care – PPO | Admitting: Family Medicine

## 2022-02-16 ENCOUNTER — Other Ambulatory Visit: Payer: Self-pay

## 2022-02-16 ENCOUNTER — Telehealth: Payer: Self-pay | Admitting: Family Medicine

## 2022-02-16 VITALS — BP 135/75 | HR 69 | Temp 97.5°F | Ht 67.75 in | Wt 166.0 lb

## 2022-02-16 DIAGNOSIS — E781 Pure hyperglyceridemia: Secondary | ICD-10-CM

## 2022-02-16 DIAGNOSIS — E559 Vitamin D deficiency, unspecified: Secondary | ICD-10-CM

## 2022-02-16 DIAGNOSIS — M542 Cervicalgia: Secondary | ICD-10-CM

## 2022-02-16 DIAGNOSIS — I10 Essential (primary) hypertension: Secondary | ICD-10-CM

## 2022-02-16 MED ORDER — PREDNISONE 20 MG PO TABS: ORAL_TABLET | ORAL | 0 refills | Status: DC

## 2022-02-16 NOTE — Progress Notes (Signed)
   Subjective:    Patient ID: Anita Shea, female    DOB: 08/08/1960, 61 y.o.   MRN: [id number]  HPI  Neck pain and headache since last week Thursday  Last year had same symptoms Patient relates same symptoms the past couple years we reflected on the notes of his neck pain back and head pain plus also sinus pressure patient denies high fever chills sweats denies numbness or tingling down the arms no chest congestion or tightness no sore throat no nausea or vomiting Review of Systems     Objective:   Physical Exam Lungs are clear no crackles heart regular pulses normal neck is subjective discomfort trapezius bilateral and paraspinal muscles with the increased discomfort both left and right with rotation and flexion and tilts      Assessment & Plan:  This seems more musculoskeletal I doubt that there is any type of COVID or other active infection going on we will progress forward with prednisone she is allergic to NSAIDs hold off on any type of CT or MRI I see no sign of meningitis currently if she gets worse she is to let us know and give [place] updates next week how she is doing she may use muscle relaxers at night caution drowsiness I doubt that this is related to her thoracic syrinx she will keep [place] posted on her symptoms and progress

## 2022-02-16 NOTE — Telephone Encounter (Signed)
Patient has a neck issue that is bothering her  We will see her today at 4:00 (410) she is aware please put her on my schedule thank you

## 2022-03-07 ENCOUNTER — Other Ambulatory Visit: Payer: Self-pay | Admitting: Nurse Practitioner

## 2022-03-07 ENCOUNTER — Other Ambulatory Visit: Payer: Self-pay | Admitting: Family Medicine

## 2022-03-07 DIAGNOSIS — I10 Essential (primary) hypertension: Secondary | ICD-10-CM

## 2022-03-07 MED ORDER — CYCLOBENZAPRINE HCL 5 MG PO TABS: ORAL_TABLET | ORAL | 0 refills | Status: DC

## 2022-03-07 MED ORDER — POTASSIUM CHLORIDE ER 10 MEQ PO TBCR: EXTENDED_RELEASE_TABLET | ORAL | 0 refills | Status: DC

## 2022-03-24 ENCOUNTER — Encounter: Payer: Self-pay | Admitting: Family Medicine

## 2022-03-24 DIAGNOSIS — I10 Essential (primary) hypertension: Secondary | ICD-10-CM

## 2022-03-24 MED ORDER — HYDROCHLOROTHIAZIDE 25 MG PO TABS: 25.0000 mg | ORAL_TABLET | Freq: Every day | ORAL | 1 refills | Status: DC

## 2022-03-24 NOTE — Telephone Encounter (Signed)
Nurses May have 6 months on HCTZ We can discuss further blood pressure related issues at follow-up visit As for lab work I have already previously ordered lipid profile kidney functions and vitamin D  Previous vitamin B12 was excellent.  I would not recommend checking that just yet.  Also uncertain if insurance company would even cover it given that we just checked a few months ago.

## 2022-04-20 ENCOUNTER — Other Ambulatory Visit: Payer: Self-pay | Admitting: Family Medicine

## 2022-04-20 ENCOUNTER — Encounter: Payer: Self-pay | Admitting: Family Medicine

## 2022-04-20 DIAGNOSIS — I10 Essential (primary) hypertension: Secondary | ICD-10-CM

## 2022-04-20 LAB — LIPID PANEL
Chol/HDL Ratio: 4.6 ratio — ABNORMAL HIGH (ref 0.0–4.4)
Cholesterol, Total: 230 mg/dL — ABNORMAL HIGH (ref 100–199)
HDL: 50 mg/dL (ref >39)
LDL Chol Calc (NIH): 149 mg/dL — ABNORMAL HIGH (ref 0–99)
Triglycerides: 173 mg/dL — ABNORMAL HIGH (ref 0–149)
VLDL Cholesterol Cal: 31 mg/dL (ref 5–40)

## 2022-04-20 LAB — BASIC METABOLIC PANEL
BUN/Creatinine Ratio: 11 — ABNORMAL LOW (ref 12–28)
BUN: 8 mg/dL (ref 8–27)
CO2: 24 mmol/L (ref 20–29)
Calcium: 10 mg/dL (ref 8.7–10.3)
Chloride: 103 mmol/L (ref 96–106)
Creatinine, Ser: 0.71 mg/dL (ref 0.57–1.00)
Glucose: 99 mg/dL (ref 70–99)
Potassium: 4.3 mmol/L (ref 3.5–5.2)
Sodium: 143 mmol/L (ref 134–144)
eGFR: 97 mL/min/{1.73_m2} (ref >59)

## 2022-04-20 LAB — VITAMIN D 25 HYDROXY (VIT D DEFICIENCY, FRACTURES): Vit D, 25-Hydroxy: 36.1 ng/mL (ref 30.0–100.0)

## 2022-04-21 ENCOUNTER — Ambulatory Visit: Payer: Self-pay | Admitting: Family Medicine

## 2022-04-25 ENCOUNTER — Ambulatory Visit: Payer: BC Managed Care – PPO | Admitting: Family Medicine

## 2022-04-25 VITALS — BP 120/70 | HR 75 | Temp 97.7°F

## 2022-04-25 DIAGNOSIS — I10 Essential (primary) hypertension: Secondary | ICD-10-CM

## 2022-04-25 DIAGNOSIS — E785 Hyperlipidemia, unspecified: Secondary | ICD-10-CM

## 2022-04-25 MED ORDER — POTASSIUM CHLORIDE ER 10 MEQ PO TBCR: EXTENDED_RELEASE_TABLET | ORAL | 3 refills | Status: DC

## 2022-04-25 MED ORDER — HYDROCHLOROTHIAZIDE 25 MG PO TABS: 25.0000 mg | ORAL_TABLET | Freq: Every day | ORAL | 3 refills | Status: DC

## 2022-04-25 NOTE — Progress Notes (Signed)
   Subjective:    Patient ID: Anita Shea, female    DOB: Oct 12, 1960, 61 y.o.   MRN: [id number]  HPI Follow up for lab results , cholesterol levels She has been doing a awesome job of healthy eating She fits and exercise on a regular basis She has brought her weight down Recent lab work shows LDL still moderately elevated This is been a disappointment for her but there is genetics play a role in this Not having any coronary artery symptoms  The 10-year ASCVD risk score (Arnett DK, et al., 2019) is: 4.6%   Values used to calculate the score:     Age: 61 years     Sex: Female     Is Non-Hispanic African American: No     Diabetic: No     Tobacco smoker: No     Systolic Blood Pressure: 712 mmHg     Is BP treated: Yes     HDL Cholesterol: 50 mg/dL     Total Cholesterol: 230 mg/dL   Review of Systems     Objective:   Physical Exam General-in no acute distress Eyes-no discharge Lungs-respiratory rate normal, CTA CV-no murmurs,RRR Extremities skin warm dry no edema Neuro grossly normal Behavior normal, alert        Assessment & Plan:   1. Essential hypertension, benign Blood pressure good control continue medication watch diet recent lab work good - hydrochlorothiazide (HYDRODIURIL) 25 MG tablet; Take 1 tablet (25 mg total) by mouth daily.  Dispense: 90 tablet; Refill: 3 - potassium chloride (KLOR-CON) 10 MEQ tablet; TAKE 2 TABLETS BY MOUTH EVERY MORNING AND 1 TABLET IN THE AFTERNOON  Dispense: 90 tablet; Refill: 3  2. Hyperlipidemia, unspecified hyperlipidemia type LDL moderately elevated We did discuss risk stratification utilizing coronary calcium score

## 2022-05-10 ENCOUNTER — Ambulatory Visit (HOSPITAL_COMMUNITY)
Admission: RE | Admit: 2022-05-10 | Discharge: 2022-05-10 | Disposition: A | Discharge status: Home / Self Care | Payer: BC Managed Care – PPO | Source: Ambulatory Visit | Attending: Family Medicine | Admitting: Family Medicine

## 2022-05-10 DIAGNOSIS — E785 Hyperlipidemia, unspecified: Secondary | ICD-10-CM | POA: Insufficient documentation

## 2022-05-10 DIAGNOSIS — I10 Essential (primary) hypertension: Secondary | ICD-10-CM | POA: Insufficient documentation

## 2022-05-16 ENCOUNTER — Encounter (INDEPENDENT_AMBULATORY_CARE_PROVIDER_SITE_OTHER): Payer: Self-pay | Admitting: Gastroenterology

## 2022-05-16 ENCOUNTER — Encounter: Payer: Self-pay | Admitting: Family Medicine

## 2022-05-16 ENCOUNTER — Other Ambulatory Visit: Payer: Self-pay | Admitting: Family Medicine

## 2022-05-16 MED ORDER — ROSUVASTATIN CALCIUM 10 MG PO TABS: 10.0000 mg | ORAL_TABLET | Freq: Every day | ORAL | 1 refills | Status: DC

## 2022-05-17 ENCOUNTER — Telehealth: Payer: Self-pay | Admitting: Family Medicine

## 2022-05-17 ENCOUNTER — Other Ambulatory Visit: Payer: Self-pay | Admitting: Family Medicine

## 2022-05-17 ENCOUNTER — Other Ambulatory Visit: Payer: Self-pay | Admitting: *Deleted

## 2022-05-17 DIAGNOSIS — E785 Hyperlipidemia, unspecified: Secondary | ICD-10-CM

## 2022-05-17 DIAGNOSIS — Z79899 Other long term (current) drug therapy: Secondary | ICD-10-CM

## 2022-05-17 MED ORDER — ROSUVASTATIN CALCIUM 10 MG PO TABS: 10.0000 mg | ORAL_TABLET | Freq: Every day | ORAL | 1 refills | Status: DC

## 2022-05-17 NOTE — Telephone Encounter (Signed)
Nurses Patient requested that her Crestor be sent to [facility] This has been completed. Please call [facility] asked them to cancel her prescription for Crestor thank you

## 2022-05-18 NOTE — Telephone Encounter (Signed)
[facility] to let them know to cancel cholesterol prescription.

## 2022-05-27 ENCOUNTER — Encounter: Payer: Self-pay | Admitting: Family Medicine

## 2022-05-29 ENCOUNTER — Encounter: Payer: Self-pay | Admitting: Family Medicine

## 2022-05-30 ENCOUNTER — Ambulatory Visit: Payer: BC Managed Care – PPO | Admitting: Family Medicine

## 2022-05-30 ENCOUNTER — Telehealth: Payer: Self-pay | Admitting: Family Medicine

## 2022-05-30 VITALS — BP 136/84 | HR 100 | Temp 97.5°F | Wt 164.4 lb

## 2022-05-30 DIAGNOSIS — R509 Fever, unspecified: Secondary | ICD-10-CM

## 2022-05-30 DIAGNOSIS — J029 Acute pharyngitis, unspecified: Secondary | ICD-10-CM

## 2022-05-30 DIAGNOSIS — M791 Myalgia, unspecified site: Secondary | ICD-10-CM

## 2022-05-30 LAB — POCT RAPID STREP A (OFFICE): Rapid Strep A Screen: POSITIVE — AB

## 2022-05-30 MED ORDER — AMOXICILLIN 500 MG PO CAPS: 500.0000 mg | ORAL_CAPSULE | Freq: Three times a day (TID) | ORAL | 0 refills | Status: AC

## 2022-05-30 NOTE — Progress Notes (Signed)
   Subjective:    Patient ID: Anita Shea, female    DOB: [birth year], 61 y.o.   MRN: [id number]  Headache  Associated symptoms include a fever, muscle aches, neck pain, sinus pressure and a sore throat.  Patient stated symptoms started Friday and worsened. Temp 100.2 under the arm  Patient states she stopped taking rosuvastatin on Saturday 05/28/22 due to body aches. Patient had significant leg pain and discomfort is related to Crestor   Review of Systems  Constitutional:  Positive for fever.  HENT:  Positive for sinus pressure and sore throat.   Musculoskeletal:  Positive for neck pain.  Neurological:  Positive for headaches.       Objective:   Physical Exam Throat.  Thematous along with bilateral cervical lymph nodes tenderness lungs clear heart regular       Assessment & Plan:  1. Sore throat Positive strep test Go ahead with antibiotics Warning signs discussed Should gradually get better over the next several days  - POCT rapid strep A  2. Febrile illness Due to strep initially thought there could be COVID as well but with strep test being positive likelihood of COVID very left low  3. Myalgia Patient had significant muscle aches with 10 mg of Crestor daily I recommend she cut it down to a half a tablet Monday Wednesday Friday If she does well with this over 2 weeks then bump it up to the full tablet Monday Wednesday Friday check lab work 6 to 8 weeks after starting Monday Wednesday Friday If she has significant pain discomfort then consider Praluent or Repatha and further discussion

## 2022-05-30 NOTE — Telephone Encounter (Signed)
Per patient my chart message:The aching of legs went to massive headache and very bad sore throat and a fever of 100.2. I still think the new medicine had something to do with bad aches but definitely there is something going on that is not related to the new medicine. Do you think I need to come in? Tylenol sinus medicine is helping the headache but I am limiting myself to one every 6 hours. I have not tried anything but regular tylenol at night and woke up all during the night with a really bad headache. My throat is very bad too.

## 2022-05-30 NOTE — Telephone Encounter (Signed)
Pt contacted and placed on schedule for 05/30/22.

## 2022-05-30 NOTE — Telephone Encounter (Signed)
Nurses 4:10 pm today for OV

## 2022-05-30 NOTE — Telephone Encounter (Signed)
Nurses Please reach out to [facility] Please discussed with her what her symptoms are. (Fevers?  Body aches?  Headaches?  Other symptoms? ) What is going on with her.  We should be able to get her in within the next 24 hours.  Please feel free to send me the replies of what is going on then I can help determine if she needs to be worked in this afternoon or set up for tomorrow

## 2022-06-22 ENCOUNTER — Encounter: Payer: Self-pay | Admitting: Family Medicine

## 2022-06-22 DIAGNOSIS — I10 Essential (primary) hypertension: Secondary | ICD-10-CM

## 2022-06-23 MED ORDER — HYDROCHLOROTHIAZIDE 25 MG PO TABS: 25.0000 mg | ORAL_TABLET | Freq: Every day | ORAL | 0 refills | Status: DC

## 2022-07-18 ENCOUNTER — Ambulatory Visit: Payer: BC Managed Care – PPO | Admitting: Family Medicine

## 2022-07-18 ENCOUNTER — Encounter: Payer: Self-pay | Admitting: Family Medicine

## 2022-07-18 VITALS — BP 138/80 | HR 76 | Temp 97.8°F | Ht 67.75 in | Wt 167.0 lb

## 2022-07-18 DIAGNOSIS — J069 Acute upper respiratory infection, unspecified: Secondary | ICD-10-CM | POA: Diagnosis not present

## 2022-07-18 DIAGNOSIS — G950 Syringomyelia and syringobulbia: Secondary | ICD-10-CM

## 2022-07-18 MED ORDER — BENZONATATE 100 MG PO CAPS: 100.0000 mg | ORAL_CAPSULE | Freq: Three times a day (TID) | ORAL | 2 refills | Status: DC | PRN

## 2022-07-18 NOTE — Progress Notes (Signed)
   Subjective:    Patient ID: Anita Shea, female    DOB: March 21, 1961, 62 y.o.   MRN: [id number]  HPI Nasal congestion and drainage Cough productive x 4 days describes as a bad cough Denies fever, wheezing, or sore throat, no testing for covid OTC meds taken- nyquil, sinus old meds, sudafed, tylenol Started Friday Felt bad Saturday Sunday felt bad Cooler air helps Low energy Normal temps Coughing up some   Review of Systems     Objective:   Physical Exam Gen-NAD not toxic TMS-normal bilateral T- normal no redness Chest-CTA respiratory rate normal no crackles CV RRR no murmur Skin-warm dry Neuro-grossly normal  Currently on hold off of antibiotics.  But patient to keep [place] updated.  If progressive troubles or problems may need to do other intervention      Assessment & Plan:  Viral syndrome I do not find evidence of secondary bacterial component currently Could be RSV could be other viruses doubtful that it is flu or COVID. Patient to do home COVID test Supportive measures discussed  History of syrinx of spinal cord stable no voiced complaints

## 2022-07-23 ENCOUNTER — Other Ambulatory Visit: Payer: Self-pay | Admitting: Family Medicine

## 2022-07-23 DIAGNOSIS — I10 Essential (primary) hypertension: Secondary | ICD-10-CM

## 2022-07-25 ENCOUNTER — Encounter: Payer: Self-pay | Admitting: Family Medicine

## 2022-07-25 DIAGNOSIS — I10 Essential (primary) hypertension: Secondary | ICD-10-CM

## 2022-07-25 DIAGNOSIS — E785 Hyperlipidemia, unspecified: Secondary | ICD-10-CM

## 2022-07-25 MED ORDER — POTASSIUM CHLORIDE ER 10 MEQ PO TBCR: EXTENDED_RELEASE_TABLET | ORAL | 5 refills | Status: DC

## 2022-07-25 NOTE — Telephone Encounter (Signed)
Nurses Thanks for updating her potassium refills  It would be fine for her to go up to a full tablet on Monday Wednesday Friday-hopefully she will tolerate that well. Please change the instructions for her cholesterol medicine to indicate that she will be doing 1 tablet Monday Wednesday Friday She has a follow-up office visit toward the end of April To do metabolic 7, lipid, liver along with urine ACR before that follow-up visit Diagnoses HTN hyperlipidemia  Thanks-Dr. Wolfgang Phoenix

## 2022-07-26 MED ORDER — ROSUVASTATIN CALCIUM 10 MG PO TABS: ORAL_TABLET | ORAL | 1 refills | Status: DC

## 2022-08-27 ENCOUNTER — Other Ambulatory Visit: Payer: Self-pay | Admitting: Family Medicine

## 2022-08-27 DIAGNOSIS — I10 Essential (primary) hypertension: Secondary | ICD-10-CM

## 2022-09-29 ENCOUNTER — Encounter: Payer: Self-pay | Admitting: Family Medicine

## 2022-09-29 ENCOUNTER — Other Ambulatory Visit (HOSPITAL_COMMUNITY)
Admission: RE | Admit: 2022-09-29 | Discharge: 2022-09-29 | Disposition: A | Discharge status: Home / Self Care | Payer: BC Managed Care – PPO | Source: Ambulatory Visit | Attending: Family Medicine | Admitting: Family Medicine

## 2022-09-29 ENCOUNTER — Ambulatory Visit (INDEPENDENT_AMBULATORY_CARE_PROVIDER_SITE_OTHER): Payer: BC Managed Care – PPO | Admitting: Family Medicine

## 2022-09-29 VITALS — BP 134/85 | HR 96 | Temp 99.0°F | Ht 67.75 in | Wt 165.0 lb

## 2022-09-29 DIAGNOSIS — K5792 Diverticulitis of intestine, part unspecified, without perforation or abscess without bleeding: Secondary | ICD-10-CM

## 2022-09-29 LAB — CBC WITH DIFFERENTIAL/PLATELET
Abs Immature Granulocytes: 0.03 10*3/uL (ref 0.00–0.07)
Basophils Absolute: 0 10*3/uL (ref 0.0–0.1)
Basophils Relative: 0 %
Eosinophils Absolute: 0.1 10*3/uL (ref 0.0–0.5)
Eosinophils Relative: 1 %
HCT: 41.6 % (ref 36.0–46.0)
Hemoglobin: 13.9 g/dL (ref 12.0–15.0)
Immature Granulocytes: 0 %
Lymphocytes Relative: 20 %
Lymphs Abs: 2 10*3/uL (ref 0.7–4.0)
MCH: 29.6 pg (ref 26.0–34.0)
MCHC: 33.4 g/dL (ref 30.0–36.0)
MCV: 88.5 fL (ref 80.0–100.0)
Monocytes Absolute: 0.9 10*3/uL (ref 0.1–1.0)
Monocytes Relative: 8 %
Neutro Abs: 7.2 10*3/uL (ref 1.7–7.7)
Neutrophils Relative %: 71 %
Platelets: 289 10*3/uL (ref 150–400)
RBC: 4.7 MIL/uL (ref 3.87–5.11)
RDW: 13.2 % (ref 11.5–15.5)
WBC: 10.2 10*3/uL (ref 4.0–10.5)
nRBC: 0 % (ref 0.0–0.2)

## 2022-09-29 LAB — COMPREHENSIVE METABOLIC PANEL
ALT: 15 U/L (ref 0–44)
AST: 17 U/L (ref 15–41)
Albumin: 4.3 g/dL (ref 3.5–5.0)
Alkaline Phosphatase: 74 U/L (ref 38–126)
Anion gap: 9 (ref 5–15)
BUN: 7 mg/dL (ref 6–20)
CO2: 26 mmol/L (ref 22–32)
Calcium: 9.2 mg/dL (ref 8.9–10.3)
Chloride: 98 mmol/L (ref 98–111)
Creatinine, Ser: 0.69 mg/dL (ref 0.44–1.00)
GFR, Estimated: >60 mL/min (ref >60)
Glucose, Bld: 110 mg/dL — ABNORMAL HIGH (ref 70–99)
Potassium: 3.4 mmol/L — ABNORMAL LOW (ref 3.5–5.1)
Sodium: 133 mmol/L — ABNORMAL LOW (ref 135–145)
Total Bilirubin: 0.6 mg/dL (ref 0.3–1.2)
Total Protein: 8.5 g/dL — ABNORMAL HIGH (ref 6.5–8.1)

## 2022-09-29 LAB — LIPASE, BLOOD: Lipase: 31 U/L (ref 11–51)

## 2022-09-29 MED ORDER — CIPROFLOXACIN HCL 500 MG PO TABS: 500.0000 mg | ORAL_TABLET | Freq: Two times a day (BID) | ORAL | 0 refills | Status: DC

## 2022-09-29 MED ORDER — METRONIDAZOLE 500 MG PO TABS: 500.0000 mg | ORAL_TABLET | Freq: Three times a day (TID) | ORAL | 0 refills | Status: DC

## 2022-09-29 MED ORDER — ONDANSETRON HCL 8 MG PO TABS: 8.0000 mg | ORAL_TABLET | Freq: Three times a day (TID) | ORAL | 1 refills | Status: DC | PRN

## 2022-09-29 NOTE — Progress Notes (Signed)
   Subjective:    Patient ID: Charlestine Massed, female    DOB: 03-Jan-1961, 62 y.o.   MRN: [id number]  HPI  Abdominal pain left side spread to whole stomach then to back since yesterday  Low grade , feeling cold and fatigued History of diverticulitis Onset of lower abdominal pain Worse on the left Denies dysuria hematuria hematochezia no high fevers Review of Systems     Objective:   Physical Exam  Lungs clear heart regular abdomen soft upper abdomen nontender lower abdomen tender worse in the left lower quadrant and left lower area minimal in the right lower quadrant it is more felt in the left side  No guarding or rebound    Assessment & Plan:   Probable diverticulitis-given the clinical findings and the clinical history it is reasonable to start Cipro and Flagyl Zofran as needed for nausea Lab work ordered White blood count came back normal range Recheck on Monday If progressive symptoms or worse will need CT scan Follow-up on Monday as planned If progressively worse or problems go to ER

## 2022-10-03 ENCOUNTER — Encounter: Payer: Self-pay | Admitting: Family Medicine

## 2022-10-03 ENCOUNTER — Ambulatory Visit: Payer: BC Managed Care – PPO | Admitting: Family Medicine

## 2022-10-03 VITALS — BP 128/82 | HR 71 | Ht 67.75 in | Wt 167.8 lb

## 2022-10-03 DIAGNOSIS — R1030 Lower abdominal pain, unspecified: Secondary | ICD-10-CM | POA: Diagnosis not present

## 2022-10-03 DIAGNOSIS — K5792 Diverticulitis of intestine, part unspecified, without perforation or abscess without bleeding: Secondary | ICD-10-CM | POA: Diagnosis not present

## 2022-10-03 NOTE — Progress Notes (Signed)
   Subjective:    Patient ID: Anita Shea, female    DOB: 07-Oct-1960, 62 y.o.   MRN: [id number]  HPI Patient arrives today for follow up diverticulitis. Patient was seen last week with diverticulitis because of lower abdominal pain fever chills not feeling good aching discomfort.  Over the weekend no fevers but had terrible pain on Saturday and Sunday she states that she coughed it caused severe pain and discomfort she had to lay stretched out she could not sit properly because of the pain she is feeling a little better today but not much she is able to tolerate the medicine but it causes intense nausea she has been on Cipro and metronidazole she is taking Zofran as well she has a history of diverticulitis there was no scan done on this it was treated based on history and physical exam last week  Review of Systems     Objective:   Physical Exam Lungs clear respiratory rate is normal heart is regular pulse is normal BP is good abdomen with nurse present lower abdominal tenderness left lower quadrant left mid quadrant significant tenderness but no surgical abdomen detected       Assessment & Plan:  Diverticulitis is the presumptive diagnosis Lower abdominal pain and discomfort She has been on antibiotics 3 days and had a bad weekend We need to do a CAT scan to confirm what we are treating and to make sure there is not a microperforation or abscess stat scan ordered through [facility]

## 2022-10-07 ENCOUNTER — Other Ambulatory Visit: Payer: Self-pay | Admitting: Family Medicine

## 2022-10-07 MED ORDER — CIPROFLOXACIN HCL 500 MG PO TABS: 500.0000 mg | ORAL_TABLET | Freq: Two times a day (BID) | ORAL | 0 refills | Status: DC

## 2022-10-10 ENCOUNTER — Encounter: Payer: Self-pay | Admitting: Family Medicine

## 2022-10-16 ENCOUNTER — Encounter: Payer: Self-pay | Admitting: Family Medicine

## 2022-10-16 DIAGNOSIS — E785 Hyperlipidemia, unspecified: Secondary | ICD-10-CM

## 2022-10-16 DIAGNOSIS — Z79899 Other long term (current) drug therapy: Secondary | ICD-10-CM

## 2022-10-16 DIAGNOSIS — I10 Essential (primary) hypertension: Secondary | ICD-10-CM

## 2022-10-16 DIAGNOSIS — E559 Vitamin D deficiency, unspecified: Secondary | ICD-10-CM

## 2022-10-17 NOTE — Telephone Encounter (Signed)
Nurses I recommend lipid, liver, metabolic 7, CBC, vitamin D, Diverticulitis, low vitamin D, hyperlipidemia, leukocytosis  As for the wrong vegetables and fruits I believe she can start adding these back thank you

## 2022-10-20 LAB — CBC WITH DIFFERENTIAL/PLATELET
Basophils Absolute: 0 10*3/uL (ref 0.0–0.2)
Hematocrit: 42.5 % (ref 34.0–46.6)
Immature Granulocytes: 0 %
Monocytes: 8 %

## 2022-10-20 LAB — LIPID PANEL

## 2022-10-20 LAB — VITAMIN D 25 HYDROXY (VIT D DEFICIENCY, FRACTURES)

## 2022-10-20 LAB — BASIC METABOLIC PANEL

## 2022-10-21 LAB — CBC WITH DIFFERENTIAL/PLATELET
EOS (ABSOLUTE): 0.2 10*3/uL (ref 0.0–0.4)
Lymphocytes Absolute: 2.2 10*3/uL (ref 0.7–3.1)
Lymphs: 37 %
MCHC: 32.7 g/dL (ref 31.5–35.7)

## 2022-10-21 LAB — LIPID PANEL
Cholesterol, Total: 188 mg/dL (ref 100–199)
LDL Chol Calc (NIH): 105 mg/dL — ABNORMAL HIGH (ref 0–99)

## 2022-10-21 LAB — BASIC METABOLIC PANEL
CO2: 25 mmol/L (ref 20–29)
Potassium: 4.1 mmol/L (ref 3.5–5.2)

## 2022-10-21 LAB — MICROALBUMIN / CREATININE URINE RATIO

## 2022-10-22 LAB — CBC WITH DIFFERENTIAL/PLATELET
MCV: 90 fL (ref 79–97)
Monocytes Absolute: 0.5 10*3/uL (ref 0.1–0.9)
Neutrophils: 51 %
WBC: 6 10*3/uL (ref 3.4–10.8)

## 2022-10-22 LAB — MAGNESIUM: Magnesium: 2.1 mg/dL (ref 1.6–2.3)

## 2022-10-22 LAB — SPECIMEN STATUS REPORT

## 2022-10-24 ENCOUNTER — Ambulatory Visit: Payer: BC Managed Care – PPO | Admitting: Family Medicine

## 2022-10-24 VITALS — BP 118/80 | HR 92 | Ht 67.75 in | Wt 165.6 lb

## 2022-10-24 DIAGNOSIS — I10 Essential (primary) hypertension: Secondary | ICD-10-CM | POA: Diagnosis not present

## 2022-10-24 DIAGNOSIS — Z8719 Personal history of other diseases of the digestive system: Secondary | ICD-10-CM

## 2022-10-24 DIAGNOSIS — E559 Vitamin D deficiency, unspecified: Secondary | ICD-10-CM

## 2022-10-24 DIAGNOSIS — R1032 Left lower quadrant pain: Secondary | ICD-10-CM

## 2022-10-24 DIAGNOSIS — E7849 Other hyperlipidemia: Secondary | ICD-10-CM | POA: Diagnosis not present

## 2022-10-24 DIAGNOSIS — Z79899 Other long term (current) drug therapy: Secondary | ICD-10-CM

## 2022-10-24 MED ORDER — ROSUVASTATIN CALCIUM 10 MG PO TABS: ORAL_TABLET | ORAL | 1 refills | Status: DC

## 2022-10-24 NOTE — Progress Notes (Signed)
Subjective:    Patient ID: Anita Shea, female    DOB: 04/08/1961, 62 y.o.   MRN: [id number] Very nice patient HPI Patient arrives today 6 month follow for medication (statin) and lab work.  Patient still having ongoing symptoms from diverticulitis.  Did have a CAT scan that did not show abscess but did show diverticulitis in the left lower quadrant She was on the proper amount of antibiotics She is a vegetarian She tries to eat very healthy Exercises regular basis She wondered if stool retention versus vitamin D could be causing this Should be noted that her bowel movements tend to be regular and soft She denies fever or chills  Results for orders placed or performed in visit on 10/16/22  VITAMIN D 25 Hydroxy (Vit-D Deficiency, Fractures)  Result Value Ref Range   Vit D, 25-Hydroxy 33.1 30.0 - 100.0 ng/mL  CBC with Differential/Platelet  Result Value Ref Range   WBC 6.0 3.4 - 10.8 x10E3/uL   RBC 4.73 3.77 - 5.28 x10E6/uL   Hemoglobin 13.9 11.1 - 15.9 g/dL   Hematocrit 40.9 81.1 - 46.6 %   MCV 90 79 - 97 fL   MCH 29.4 26.6 - 33.0 pg   MCHC 32.7 31.5 - 35.7 g/dL   RDW 91.4 78.2 - 95.6 %   Platelets 355 150 - 450 x10E3/uL   Neutrophils 51 Not Estab. %   Lymphs 37 Not Estab. %   Monocytes 8 Not Estab. %   Eos 3 Not Estab. %   Basos 1 Not Estab. %   Neutrophils Absolute 3.1 1.4 - 7.0 x10E3/uL   Lymphocytes Absolute 2.2 0.7 - 3.1 x10E3/uL   Monocytes Absolute 0.5 0.1 - 0.9 x10E3/uL   EOS (ABSOLUTE) 0.2 0.0 - 0.4 x10E3/uL   Basophils Absolute 0.0 0.0 - 0.2 x10E3/uL   Immature Granulocytes 0 Not Estab. %   Immature Grans (Abs) 0.0 0.0 - 0.1 x10E3/uL  Basic metabolic panel  Result Value Ref Range   Glucose 91 70 - 99 mg/dL   BUN 12 8 - 27 mg/dL   Creatinine, Ser 2.13 0.57 - 1.00 mg/dL   eGFR 92 >08 MV/HQI/6.96   BUN/Creatinine Ratio 16 12 - 28   Sodium 138 134 - 144 mmol/L   Potassium 4.1 3.5 - 5.2 mmol/L   Chloride 95 (L) 96 - 106 mmol/L   CO2 25 20 - 29 mmol/L    Calcium 9.8 8.7 - 10.3 mg/dL  Microalbumin / creatinine urine ratio  Result Value Ref Range   Creatinine, Urine WILL FOLLOW    Microalbumin, Urine WILL FOLLOW    Microalb/Creat Ratio WILL FOLLOW   Lipid panel  Result Value Ref Range   Cholesterol, Total 188 100 - 199 mg/dL   Triglycerides 295 (H) 0 - 149 mg/dL   HDL 52 >28 mg/dL   VLDL Cholesterol Cal 31 5 - 40 mg/dL   LDL Chol Calc (NIH) 413 (H) 0 - 99 mg/dL   Chol/HDL Ratio 3.6 0.0 - 4.4 ratio  Magnesium  Result Value Ref Range   Magnesium 2.1 1.6 - 2.3 mg/dL  Specimen status report  Result Value Ref Range   specimen status report Comment    Labs were reviewed in detail LDL 105 with a goal of being less than 70  Essential hypertension, benign - Plan: Hepatic Function Panel  Other hyperlipidemia - Plan: Lipid panel  LLQ pain - Plan: Ambulatory referral to Gastroenterology  Vitamin D deficiency  History of colonic diverticulitis  High risk  medication use   Review of Systems     Objective:   Physical Exam General-in no acute distress Eyes-no discharge Lungs-respiratory rate normal, CTA CV-no murmurs,RRR Extremities skin warm dry no edema Neuro grossly normal Behavior normal, alert Abdomen some tenderness in the left lower quadrant       Assessment & Plan:  1. Essential hypertension, benign Blood pressure good control continue current measures - Hepatic Function Panel  2. Other hyperlipidemia Cholesterol goal is to get LDL below 70 and just statin has history of positive coronary calcium - Lipid panel  3. LLQ pain Referral to Dr. Levon Hedger.  May end up needing to have colonoscopy early Patient does not want to do this Patient did have CT scan at outside facility [facility] did not show any growth but did show diverticulitis  - Ambulatory referral to Gastroenterology  4. Vitamin D deficiency Patient is already on vitamin D approximately 2500 daily and her vitamin D level low normal I do not feel that  this is the cause of her diverticulitis but patient is looking into various research and other potential causes of diverticulitis  5. History of colonic diverticulitis Patient is a very healthy eater I do not really feel there is anything different she can do to try to help this  She is doing a good job keeping her weight down Blood pressure overall looks good

## 2022-10-25 ENCOUNTER — Encounter (INDEPENDENT_AMBULATORY_CARE_PROVIDER_SITE_OTHER): Payer: Self-pay | Admitting: *Deleted

## 2022-11-01 ENCOUNTER — Other Ambulatory Visit: Payer: Self-pay | Admitting: Family Medicine

## 2022-11-01 LAB — CBC WITH DIFFERENTIAL/PLATELET
Basos: 1 %
Eos: 3 %
Hemoglobin: 13.9 g/dL (ref 11.1–15.9)
Immature Grans (Abs): 0 10*3/uL (ref 0.0–0.1)
MCH: 29.4 pg (ref 26.6–33.0)
Neutrophils Absolute: 3.1 10*3/uL (ref 1.4–7.0)
Platelets: 355 10*3/uL (ref 150–450)
RBC: 4.73 x10E6/uL (ref 3.77–5.28)
RDW: 12.9 % (ref 11.7–15.4)

## 2022-11-01 LAB — BASIC METABOLIC PANEL
BUN/Creatinine Ratio: 16 (ref 12–28)
BUN: 12 mg/dL (ref 8–27)
Chloride: 95 mmol/L — ABNORMAL LOW (ref 96–106)
Creatinine, Ser: 0.74 mg/dL (ref 0.57–1.00)
Glucose: 91 mg/dL (ref 70–99)
Sodium: 138 mmol/L (ref 134–144)
eGFR: 92 mL/min/{1.73_m2} (ref >59)

## 2022-11-01 LAB — LIPID PANEL
Chol/HDL Ratio: 3.6 ratio (ref 0.0–4.4)
HDL: 52 mg/dL (ref >39)

## 2022-11-01 LAB — MICROALBUMIN / CREATININE URINE RATIO

## 2022-11-30 ENCOUNTER — Encounter: Payer: Self-pay | Admitting: Family Medicine

## 2022-11-30 NOTE — Telephone Encounter (Signed)
Nurses I certainly understand what is being asked It would be in the best interest of making sure we are thoroughly taking care of the issue and not overlooking anything-therefore I recommend office visit.  I am willing to work her into the office schedule Friday afternoon as a work in she may come a little bit before 4:00 and after the evaluation more than likely do blood work next-door at [facility]  Please help set that up thank you-Dr. Lorin Picket

## 2022-12-02 ENCOUNTER — Ambulatory Visit: Payer: BC Managed Care – PPO | Admitting: Family Medicine

## 2022-12-02 VITALS — BP 138/80 | HR 100 | Ht 67.75 in | Wt 170.2 lb

## 2022-12-02 DIAGNOSIS — S70361A Insect bite (nonvenomous), right thigh, initial encounter: Secondary | ICD-10-CM | POA: Diagnosis not present

## 2022-12-02 DIAGNOSIS — L089 Local infection of the skin and subcutaneous tissue, unspecified: Secondary | ICD-10-CM

## 2022-12-02 DIAGNOSIS — W57XXXA Bitten or stung by nonvenomous insect and other nonvenomous arthropods, initial encounter: Secondary | ICD-10-CM | POA: Diagnosis not present

## 2022-12-02 DIAGNOSIS — R5383 Other fatigue: Secondary | ICD-10-CM | POA: Diagnosis not present

## 2022-12-02 DIAGNOSIS — I10 Essential (primary) hypertension: Secondary | ICD-10-CM

## 2022-12-02 MED ORDER — CYCLOBENZAPRINE HCL 5 MG PO TABS: ORAL_TABLET | ORAL | 5 refills | Status: DC

## 2022-12-02 NOTE — Progress Notes (Signed)
   Subjective:    Patient ID: Anita Shea, female    DOB: February 26, 1961, 62 y.o.   MRN: [id number]  HPI Patient arrives today for tick evaluation. Patient states tick bites are not itchy or red area .  Patient relates a lot of fatigue tiredness over the past few weeks little bit of stiffness in her neck energy level subpar just does not feel like doing as much physical is what she was.  She denies any setbacks.  Her appetite and other measures seem to be doing okay. Patient states she needs refill on Flexeril.   Review of Systems     Objective:   Physical Exam General-in no acute distress Eyes-no discharge Lungs-respiratory rate normal, CTA CV-no murmurs,RRR Extremities skin warm dry no edema Neuro grossly normal Behavior normal, alert         Assessment & Plan:  Patient relates she just does not feel her usual self We will do some lab work I do not find any evidence of a RMSF I find no evidence of lymphadenopathy Await the lab work

## 2022-12-03 LAB — CBC WITH DIFFERENTIAL/PLATELET
EOS (ABSOLUTE): 0.3 10*3/uL (ref 0.0–0.4)
MCH: 29.7 pg (ref 26.6–33.0)
MCHC: 33.5 g/dL (ref 31.5–35.7)
Monocytes Absolute: 0.7 10*3/uL (ref 0.1–0.9)
WBC: 9.3 10*3/uL (ref 3.4–10.8)

## 2022-12-03 LAB — COMPREHENSIVE METABOLIC PANEL
AST: 20 IU/L (ref 0–40)
Albumin/Globulin Ratio: 1.4 (ref 1.2–2.2)
BUN: 10 mg/dL (ref 8–27)
Potassium: 4.2 mmol/L (ref 3.5–5.2)
eGFR: 89 mL/min/{1.73_m2} (ref >59)

## 2022-12-03 LAB — LYME DISEASE SEROLOGY W/REFLEX

## 2022-12-05 LAB — CBC WITH DIFFERENTIAL/PLATELET
Basophils Absolute: 0.1 10*3/uL (ref 0.0–0.2)
Basos: 1 %
Eos: 3 %
Hematocrit: 41.2 % (ref 34.0–46.6)
Hemoglobin: 13.8 g/dL (ref 11.1–15.9)
Immature Grans (Abs): 0 10*3/uL (ref 0.0–0.1)
Immature Granulocytes: 0 %
Lymphocytes Absolute: 3.2 10*3/uL — ABNORMAL HIGH (ref 0.7–3.1)
Lymphs: 34 %
MCV: 89 fL (ref 79–97)
Monocytes: 7 %
Neutrophils Absolute: 5.1 10*3/uL (ref 1.4–7.0)
Neutrophils: 55 %
Platelets: 317 10*3/uL (ref 150–450)
RBC: 4.64 x10E6/uL (ref 3.77–5.28)
RDW: 12.6 % (ref 11.7–15.4)

## 2022-12-05 LAB — COMPREHENSIVE METABOLIC PANEL
ALT: 20 IU/L (ref 0–32)
Albumin: 4.8 g/dL (ref 3.9–4.9)
Alkaline Phosphatase: 89 IU/L (ref 44–121)
BUN/Creatinine Ratio: 13 (ref 12–28)
Bilirubin Total: <0.2 mg/dL (ref 0.0–1.2)
CO2: 28 mmol/L (ref 20–29)
Calcium: 10.3 mg/dL (ref 8.7–10.3)
Chloride: 99 mmol/L (ref 96–106)
Creatinine, Ser: 0.76 mg/dL (ref 0.57–1.00)
Globulin, Total: 3.4 g/dL (ref 1.5–4.5)
Glucose: 100 mg/dL — ABNORMAL HIGH (ref 70–99)
Sodium: 140 mmol/L (ref 134–144)
Total Protein: 8.2 g/dL (ref 6.0–8.5)

## 2022-12-16 ENCOUNTER — Other Ambulatory Visit: Payer: Self-pay | Admitting: Family Medicine

## 2022-12-16 DIAGNOSIS — I10 Essential (primary) hypertension: Secondary | ICD-10-CM

## 2022-12-26 ENCOUNTER — Ambulatory Visit (INDEPENDENT_AMBULATORY_CARE_PROVIDER_SITE_OTHER): Payer: BC Managed Care – PPO | Admitting: Gastroenterology

## 2023-01-30 ENCOUNTER — Telehealth: Payer: Self-pay

## 2023-01-30 NOTE — Telephone Encounter (Signed)
Prescription Request  01/30/2023  LOV: Visit date not found  What is the name of the medication or equipment? Klor-con 10 er tab  Have you contacted your pharmacy to request a refill? Yes   Which pharmacy would you like this sent to? [facility]   Patient notified that their request is being sent to the clinical staff for review and that they should receive a response within 2 business days.   Please advise at Mobile [phone] (mobile)

## 2023-02-09 ENCOUNTER — Encounter (INDEPENDENT_AMBULATORY_CARE_PROVIDER_SITE_OTHER): Payer: Self-pay | Admitting: Gastroenterology

## 2023-02-15 ENCOUNTER — Telehealth: Payer: Self-pay | Admitting: Family Medicine

## 2023-02-15 ENCOUNTER — Encounter: Payer: Self-pay | Admitting: Family Medicine

## 2023-02-15 DIAGNOSIS — M791 Myalgia, unspecified site: Secondary | ICD-10-CM

## 2023-02-15 DIAGNOSIS — Z79899 Other long term (current) drug therapy: Secondary | ICD-10-CM

## 2023-02-15 DIAGNOSIS — E7849 Other hyperlipidemia: Secondary | ICD-10-CM

## 2023-02-15 NOTE — Telephone Encounter (Signed)
Nurses We need to redo her orders Recommend lipid, liver, CK Diagnosis Hyperlipidemia Myalgias High risk medication thank you  She is aware of these orders she will do the blood work thank you

## 2023-02-16 NOTE — Telephone Encounter (Signed)
Blood work ordered in EPIC 

## 2023-02-18 LAB — CK: Total CK: 73 U/L (ref 32–182)

## 2023-02-18 LAB — HEPATIC FUNCTION PANEL
ALT: 24 IU/L (ref 0–32)
AST: 23 IU/L (ref 0–40)
Albumin: 4.5 g/dL (ref 3.9–4.9)
Alkaline Phosphatase: 87 IU/L (ref 44–121)
Bilirubin Total: 0.3 mg/dL (ref 0.0–1.2)
Bilirubin, Direct: 0.11 mg/dL (ref 0.00–0.40)
Total Protein: 7.8 g/dL (ref 6.0–8.5)

## 2023-02-18 LAB — LIPID PANEL
Chol/HDL Ratio: 3.1 ratio (ref 0.0–4.4)
Cholesterol, Total: 160 mg/dL (ref 100–199)
HDL: 51 mg/dL (ref >39)
LDL Chol Calc (NIH): 83 mg/dL (ref 0–99)
Triglycerides: 148 mg/dL (ref 0–149)
VLDL Cholesterol Cal: 26 mg/dL (ref 5–40)

## 2023-02-24 ENCOUNTER — Other Ambulatory Visit: Payer: Self-pay | Admitting: Family Medicine

## 2023-02-24 DIAGNOSIS — I10 Essential (primary) hypertension: Secondary | ICD-10-CM

## 2023-02-28 ENCOUNTER — Encounter: Payer: Self-pay | Admitting: Family Medicine

## 2023-03-06 ENCOUNTER — Ambulatory Visit (INDEPENDENT_AMBULATORY_CARE_PROVIDER_SITE_OTHER): Payer: BC Managed Care – PPO | Admitting: Gastroenterology

## 2023-03-15 ENCOUNTER — Ambulatory Visit: Payer: BC Managed Care – PPO | Admitting: Dermatology

## 2023-03-17 ENCOUNTER — Other Ambulatory Visit: Payer: Self-pay | Admitting: Family Medicine

## 2023-03-17 DIAGNOSIS — I10 Essential (primary) hypertension: Secondary | ICD-10-CM

## 2023-03-19 ENCOUNTER — Encounter: Payer: Self-pay | Admitting: Family Medicine

## 2023-03-21 ENCOUNTER — Telehealth: Payer: Self-pay | Admitting: Family Medicine

## 2023-03-21 NOTE — Telephone Encounter (Signed)
I communicated with the patient regarding her statins.  She is not tolerating them.  Therefore we are stopping rosuvastatin.  She is aware.  Please order lipid profile she will do this in approximately 6 weeks.  She is aware of the lab work.

## 2023-03-27 ENCOUNTER — Ambulatory Visit (INDEPENDENT_AMBULATORY_CARE_PROVIDER_SITE_OTHER): Payer: BC Managed Care – PPO | Admitting: Gastroenterology

## 2023-03-27 ENCOUNTER — Encounter (INDEPENDENT_AMBULATORY_CARE_PROVIDER_SITE_OTHER): Payer: Self-pay | Admitting: Gastroenterology

## 2023-03-27 VITALS — BP 142/86 | HR 98 | Temp 97.1°F | Ht 67.5 in | Wt 177.2 lb

## 2023-03-27 DIAGNOSIS — K5732 Diverticulitis of large intestine without perforation or abscess without bleeding: Secondary | ICD-10-CM

## 2023-03-27 NOTE — Patient Instructions (Signed)
Schedule colonoscopy Avoid using high dose aspirin including Goody/BC powders, NSAIDs such as Aleve, ibuprofen, naproxen, Motrin, Voltaren or Advil (even the topical ones) Avoid potential food triggers

## 2023-03-27 NOTE — Progress Notes (Signed)
Katrinka Blazing, M.D. Gastroenterology & Hepatology [facility] [address] [place], [place] [address] Primary Care Physician: Babs Sciara, MD [address] [place] [address]  Referring MD: PCP  Chief Complaint: Recurrent diverticulitis  History of Present Illness: Anita Shea is a 62 y.o. femalewith past medical history of previous diverticulitis x3, GERD, hypertension,  who presents for evaluation of recurrent diverticulitis.  Patient comes after taking an episode of diverticulitis in April 2024. She received an antibiotic for this and her diverticulitis resolved. She wanted to discuss ways to prevent diverticulitis. She does not take any NSAIDs or high dose aspirin. Has tried eating more fiber and eating less meat, also has been trying to stay more active.  Noticed that there were 2 potential triggers prior to her episode of diverticulitis which were seen exercise machine and eating some strawberries prior to the episode.  She moves her bowel every day. She takes magnesium, Colace and uses fiber regularly.  The patient currently denies having any nausea, vomiting, fever, chills, hematochezia, melena, hematemesis, abdominal distention, abdominal pain, diarrhea, jaundice, pruritus or weight loss.  Last Colonoscopy:02/2017 - Dr. Karilyn Cota - The examined portion of the ileum was normal. - One small polyp in the ascending colon. Biopsied. - Diverticulosis in the sigmoid colon.  Pathology showed tubular adenoma.  Recommend a repeat colonoscopy in 7 years.  FHx: neg for any gastrointestinal/liver disease, no malignancies Social: neg smoking, alcohol or illicit drug use Surgical: non contributory  Past Medical History: Past Medical History:  Diagnosis Date   Complication of anesthesia    Diverticulitis of colon 08/30/2016   Hypertension     Past Surgical History: Past Surgical History:  Procedure Laterality Date    CHOLECYSTECTOMY     1993, flares   COLONOSCOPY N/A 03/08/2017   Procedure: COLONOSCOPY;  Surgeon: Malissa Hippo, MD;  [facility];  Service: Endoscopy;  Laterality: N/A;  1200-rescheduled to 9/12 @ 1:00   POLYPECTOMY  03/08/2017   Procedure: POLYPECTOMY;  Surgeon: Malissa Hippo, MD;  [facility];  Service: Endoscopy;;   WISDOM TOOTH EXTRACTION      Family History: Family History  Problem Relation Age of Onset   Stroke Mother    Lung cancer Mother    Heart disease Father    Heart attack Father    Healthy Other    Healthy Other    Diabetes Brother    Heart attack Brother    Hypertension Sister    Heart attack Maternal Grandfather    Heart attack Paternal Grandfather    Healthy Daughter     Social History: Social History   Tobacco Use  Smoking Status Never  Smokeless Tobacco Never   Social History   Substance and Sexual Activity  Alcohol Use No   Social History   Substance and Sexual Activity  Drug Use No    Allergies: Allergies  Allergen Reactions   Aspirin Anaphylaxis   Lisinopril Cough   Nsaids Hives   Crestor [Rosuvastatin]     Patient had severe leg pain on several times with taking medication.  Even after a drug holiday and restarting medication she had leg pain with the medicine and not without    Medications: Current Outpatient Medications  Medication Sig Dispense Refill   calcium carbonate (TUMS EX) 750 MG chewable tablet Chew 2 tablets by mouth daily.     cetirizine (ZYRTEC) 10 MG tablet Take 10 mg by mouth daily.  dicyclomine (BENTYL) 20 MG tablet TAKE 1 TABLET BY MOUTH THREE TIMES DAILY AS NEEDED ABDOMINAL  CRAMPING 30 tablet 1   docusate sodium (COLACE) 100 MG capsule Take 2 capsules (200 mg total) by mouth daily. 60 capsule 0   fluticasone (FLONASE) 50 MCG/ACT nasal spray Place 1 spray into both nostrils daily.     hydrochlorothiazide (HYDRODIURIL) 25 MG tablet Take 1 tablet by mouth once daily 90 tablet 0    Magnesium 100 MG TABS Take by mouth.     Multiple Vitamin (MULTIVITAMIN) tablet Take 1 tablet by mouth daily.     Omeprazole Magnesium (PRILOSEC OTC PO) Take by mouth daily at 6 (six) AM.     ondansetron (ZOFRAN) 8 MG tablet Take 1 tablet (8 mg total) by mouth every 8 (eight) hours as needed for nausea. 15 tablet 1   OVER THE COUNTER MEDICATION B 12 once 1000 mcg daily  D3 2000 iu  daily.     potassium chloride (KLOR-CON) 10 MEQ tablet TAKE 2 TABLETS BY MOUTH IN THE MORNING AND 1 TABLET  IN THE AFTERNOON (Patient taking differently: 2 every morning.) 90 tablet 0   simethicone (MYLICON) 125 MG chewable tablet Chew 250 mg by mouth at bedtime.     No current facility-administered medications for this visit.    Review of Systems: GENERAL: negative for malaise, night sweats HEENT: No changes in hearing or vision, no nose bleeds or other nasal problems. NECK: Negative for lumps, goiter, pain and significant neck swelling RESPIRATORY: Negative for cough, wheezing CARDIOVASCULAR: Negative for chest pain, leg swelling, palpitations, orthopnea GI: SEE HPI MUSCULOSKELETAL: Negative for joint pain or swelling, back pain, and muscle pain. SKIN: Negative for lesions, rash PSYCH: Negative for sleep disturbance, mood disorder and recent psychosocial stressors. HEMATOLOGY Negative for prolonged bleeding, bruising easily, and swollen nodes. ENDOCRINE: Negative for cold or heat intolerance, polyuria, polydipsia and goiter. NEURO: negative for tremor, gait imbalance, syncope and seizures. The remainder of the review of systems is noncontributory.   Physical Exam: BP (!) 142/86 (BP Location: Left Arm, Patient Position: Sitting, Cuff Size: Normal)   Pulse 98   Temp (!) 97.1 F (36.2 C) (Temporal)   Ht 5' 7.5" (1.715 m)   Wt 177 lb 3.2 oz (80.4 kg)   LMP 09/18/2012   BMI 27.34 kg/m  GENERAL: The patient is AO x3, in no acute distress. HEENT: Head is normocephalic and atraumatic. EOMI are intact.  Mouth is well hydrated and without lesions. NECK: Supple. No masses LUNGS: Clear to auscultation. No presence of rhonchi/wheezing/rales. Adequate chest expansion HEART: RRR, normal s1 and s2. ABDOMEN: mildly tender to palpation in the right lower quadrant, no guarding, no peritoneal signs, and nondistended. BS +. No masses. EXTREMITIES: Without any cyanosis, clubbing, rash, lesions or edema. NEUROLOGIC: AOx3, no focal motor deficit. SKIN: no jaundice, no rashes   Imaging/Labs: as above  I personally reviewed and interpreted the available labs, imaging and endoscopic files.  Impression and Plan: Anita Shea is a 62 y.o. femalewith past medical history of previous diverticulitis x3, GERD, hypertension,  who presents for evaluation of recurrent diverticulitis.  The patient and I held a thorough discussion regarding the pathogenesis and natural history of diverticulitis.  We discussed that there is no evidence in the literature supporting any specific triggers for diverticulitis besides the use of NSAIDs or high-dose aspirin, which she has been avoiding.  I did consider reviewed recent note for her to avoid any potential food triggers that she has identified  in the past.  Nevertheless, as she has had 2 episodes in the last 7 years and she has a history of colonic polyps, I emphasized the importance of proceeding with a colonoscopy to rule out other potential etiologies such as malignancy.  She understood and agreed with proceeding with colonoscopy.  -Schedule colonoscopy -Avoid using high dose aspirin including Goody/BC powders, NSAIDs such as Aleve, ibuprofen, naproxen, Motrin, Voltaren or Advil (even the topical ones) -Avoid potential food triggers  All questions were answered.      Katrinka Blazing, MD Gastroenterology and Hepatology [facility]

## 2023-03-29 ENCOUNTER — Telehealth (INDEPENDENT_AMBULATORY_CARE_PROVIDER_SITE_OTHER): Payer: Self-pay | Admitting: Gastroenterology

## 2023-03-29 NOTE — Telephone Encounter (Signed)
Pt contacted to schedule TCS with Dr.Castaneda ASA 1. Pt states on her my chart it states she is not due until 2025. Pt states she would like to check with her insurance first. Gave her pre service number also to call to see if they could quote her a price. 2024 will make 6 years and pt states she is due every 7 years .

## 2023-04-14 ENCOUNTER — Encounter: Payer: Self-pay | Admitting: Family Medicine

## 2023-04-17 NOTE — Telephone Encounter (Signed)
Nurses-please order lipid profile thank you  Glad to hear she has not had any further leg pain

## 2023-04-19 ENCOUNTER — Other Ambulatory Visit: Payer: Self-pay

## 2023-04-19 DIAGNOSIS — E7849 Other hyperlipidemia: Secondary | ICD-10-CM

## 2023-04-20 LAB — HEPATIC FUNCTION PANEL
ALT: 19 [IU]/L (ref 0–32)
AST: 20 [IU]/L (ref 0–40)
Albumin: 4.6 g/dL (ref 3.9–4.9)
Alkaline Phosphatase: 88 [IU]/L (ref 44–121)
Bilirubin Total: 0.4 mg/dL (ref 0.0–1.2)
Bilirubin, Direct: 0.11 mg/dL (ref 0.00–0.40)
Total Protein: 7.8 g/dL (ref 6.0–8.5)

## 2023-04-20 LAB — LIPID PANEL
Chol/HDL Ratio: 4.5 {ratio} — ABNORMAL HIGH (ref 0.0–4.4)
Cholesterol, Total: 215 mg/dL — ABNORMAL HIGH (ref 100–199)
HDL: 48 mg/dL (ref >39)
LDL Chol Calc (NIH): 131 mg/dL — ABNORMAL HIGH (ref 0–99)
Triglycerides: 204 mg/dL — ABNORMAL HIGH (ref 0–149)
VLDL Cholesterol Cal: 36 mg/dL (ref 5–40)

## 2023-04-25 ENCOUNTER — Encounter: Payer: Self-pay | Admitting: Family Medicine

## 2023-04-25 ENCOUNTER — Ambulatory Visit: Payer: BC Managed Care – PPO | Admitting: Family Medicine

## 2023-04-25 VITALS — BP 128/86 | HR 96 | Temp 98.4°F | Ht 67.5 in | Wt 174.4 lb

## 2023-04-25 DIAGNOSIS — M25561 Pain in right knee: Secondary | ICD-10-CM

## 2023-04-25 DIAGNOSIS — I10 Essential (primary) hypertension: Secondary | ICD-10-CM

## 2023-04-25 DIAGNOSIS — T466X5A Adverse effect of antihyperlipidemic and antiarteriosclerotic drugs, initial encounter: Secondary | ICD-10-CM | POA: Insufficient documentation

## 2023-04-25 DIAGNOSIS — M791 Myalgia, unspecified site: Secondary | ICD-10-CM | POA: Diagnosis not present

## 2023-04-25 DIAGNOSIS — E7849 Other hyperlipidemia: Secondary | ICD-10-CM | POA: Diagnosis not present

## 2023-04-25 NOTE — Progress Notes (Signed)
Subjective:    Patient ID: Anita Shea, female    DOB: 11/11/1960, 62 y.o.   MRN: [id number]  Discussed the use of AI scribe software for clinical note transcription with the patient, who gave verbal consent to proceed.  History of Present Illness   The patient, with a history of high cholesterol and recent diverticulitis, presents with a chief complaint of significant knee pain. The pain began approximately two weeks ago, following a workout at the gym. The patient is unsure of the exact cause but suspects it may be related to the use of a knee machine. The pain is described as a stiffness encompassing the entire leg, likened to a "peg leg," with localized pain around the knee. The pain is exacerbated by walking and movement of the knee, and despite attempts at self-management with heat, ice, and Flexeril, the pain persists and has significantly limited the patient's mobility.  The patient sought care at [facility], where an x-ray of the hip was performed and found to be normal. A course of prednisone was prescribed, which initially improved symptoms. However, the pain returned and worsened after a walk around the patient's [place]. The patient has been in communication with a doctor at an urgent care center and is seeking further guidance on management.  The patient also reports a recent weight gain and elevated blood pressure, which she attributes to a decrease in exercise due to the knee pain. She expresses concern about the impact of these changes on her overall health. The patient has been adhering to a vegan diet and has stopped taking statins due to leg pain, which she believes was a side effect of the medication.  The patient also mentions a history of shingles and a recent diverticulitis attack. She is due for a colonoscopy, which has been delayed due to the current knee issue. The patient plans to schedule the procedure once the knee pain has resolved.         Review of Systems      Objective:    Physical Exam   VITALS: BP- 128/86 CHEST: Breath sounds clear bilaterally EXTREMITIES: No ankle edema MUSCULOSKELETAL: Right knee pain on lateral aspect with manipulation, increased pain with twisting and bending, no ligamentous injury or cartilage damage, no quadriceps soreness on palpation           Assessment & Plan:  Assessment and Plan    Knee Pain Acute onset of severe knee pain with stiffness and limited range of motion. No evidence of ligament or cartilage damage on physical examination. Pain exacerbated by movement and walking. -Refer to orthopedics for further evaluation, possible injection, and physical therapy. -Advise on appropriate exercises and use of cold compresses for pain management.  Hyperlipidemia Statin intolerance with muscle pain. LDL above target. -Consult with clinical pharmacist regarding possible use of Repatha or Praluent. -Consider trial of non-statin cholesterol-lowering medication if necessary.  Hypertension Elevated blood pressure readings, possibly related to pain and decreased physical activity. -Monitor blood pressure, continue current management.  Weight Gain Recent weight increase, possibly related to decreased physical activity due to knee pain. -Monitor weight, encourage return to regular exercise once knee pain is managed.  Colon Cancer Screening Due for colonoscopy due to history of tubular adenoma. -Schedule colonoscopy for January.  Vaccinations No record of shingles vaccine, recently received flu shot. -Recommend shingles vaccine.        1. Acute pain of right knee Referral to [facility] to see the doctor hopefully they will  get things better so she can resume exercise - AMB referral to orthopedics  2. Other hyperlipidemia Patient does not tolerate statins, she does have evidence of coronary artery disease on coronary calcium We will try to get Repatha or Praluent approved by her insurance Will  incorporate the help of the clinical pharmacist  3. Essential hypertension, benign Blood pressure decent control continue current measures

## 2023-04-26 ENCOUNTER — Telehealth: Payer: Self-pay | Admitting: Pharmacist

## 2023-04-26 ENCOUNTER — Other Ambulatory Visit: Payer: Self-pay | Admitting: Family Medicine

## 2023-04-26 ENCOUNTER — Encounter: Payer: Self-pay | Admitting: Family Medicine

## 2023-04-26 MED ORDER — REPATHA 140 MG/ML ~~LOC~~ SOSY: PREFILLED_SYRINGE | SUBCUTANEOUS | 5 refills | Status: DC

## 2023-04-26 NOTE — Telephone Encounter (Signed)
Nurses-please order lipoprotein a She can do this at her convenience thank you  Diagnosis hyperlipidemia elevated coronary calcium

## 2023-04-26 NOTE — Telephone Encounter (Signed)
Nurses Please connect with referral coordinator Request urgent consultation orthopedics Patient is willing to go to any of the Ortho's within her group I would recommend [facility] or [facility].

## 2023-04-26 NOTE — Telephone Encounter (Signed)
Statin intolerant (2 trials of rosuvastatin) LDL 131 Elevated CAC score LDL goal <70 Lipid Panel     Component Value Date/Time   CHOL 215 (H) 04/19/2023 0907   TRIG 204 (H) 04/19/2023 0907   HDL 48 04/19/2023 0907   CHOLHDL 4.5 (H) 04/19/2023 0907   CHOLHDL 3.4 08/15/2016 0902   VLDL 21 08/15/2016 0902   LDLCALC 131 (H) 04/19/2023 0907   LABVLDL 36 04/19/2023 0907   Plan denied due to untreated LDL <190 Will appeal

## 2023-04-27 ENCOUNTER — Other Ambulatory Visit: Payer: Self-pay

## 2023-04-27 ENCOUNTER — Ambulatory Visit (HOSPITAL_BASED_OUTPATIENT_CLINIC_OR_DEPARTMENT_OTHER): Payer: BC Managed Care – PPO | Admitting: Student

## 2023-04-27 DIAGNOSIS — M791 Myalgia, unspecified site: Secondary | ICD-10-CM

## 2023-04-27 DIAGNOSIS — M25561 Pain in right knee: Secondary | ICD-10-CM

## 2023-04-27 NOTE — Telephone Encounter (Signed)
Nurses-lipoprotein a via [facility] lab 563 Diagnosis hyperlipidemia elevated coronary calcium

## 2023-04-27 NOTE — Telephone Encounter (Signed)
Referral placed and message sent to referral specialist

## 2023-04-28 ENCOUNTER — Ambulatory Visit (HOSPITAL_BASED_OUTPATIENT_CLINIC_OR_DEPARTMENT_OTHER): Payer: BC Managed Care – PPO

## 2023-04-28 ENCOUNTER — Other Ambulatory Visit: Payer: Self-pay

## 2023-04-28 ENCOUNTER — Encounter (HOSPITAL_BASED_OUTPATIENT_CLINIC_OR_DEPARTMENT_OTHER): Payer: Self-pay | Admitting: Student

## 2023-04-28 ENCOUNTER — Ambulatory Visit (HOSPITAL_BASED_OUTPATIENT_CLINIC_OR_DEPARTMENT_OTHER): Payer: BC Managed Care – PPO | Admitting: Student

## 2023-04-28 DIAGNOSIS — M1711 Unilateral primary osteoarthritis, right knee: Secondary | ICD-10-CM | POA: Diagnosis not present

## 2023-04-28 DIAGNOSIS — M25561 Pain in right knee: Secondary | ICD-10-CM | POA: Diagnosis not present

## 2023-04-28 DIAGNOSIS — R931 Abnormal findings on diagnostic imaging of heart and coronary circulation: Secondary | ICD-10-CM

## 2023-04-28 DIAGNOSIS — E7849 Other hyperlipidemia: Secondary | ICD-10-CM

## 2023-04-28 NOTE — Progress Notes (Unsigned)
Chief Complaint: Right knee pain     History of Present Illness:    Anita Shea is a pleasant 62 y.o. female presenting today with 6-week history of knee pain.  Denies any specific injury however she did begin to develop pain after using a leg extension machine at the [facility]  She was seen in an orthopedic urgent care 3 weeks ago for hip and was put on prednisone.  This did help her hip but did not affect her knee pain.  Describes her pain as achy and is moderate in severity.  Has stiffness and pain particularly with bending the knee.  Surgical History:   None  PMH/PSH/Family History/Social History/Meds/Allergies:    Past Medical History:  Diagnosis Date   Complication of anesthesia    Diverticulitis of colon 08/30/2016   Hypertension    Past Surgical History:  Procedure Laterality Date   CHOLECYSTECTOMY     1993, flares   COLONOSCOPY N/A 03/08/2017   Procedure: COLONOSCOPY;  Surgeon: Anita Hippo, MD;  [facility];  Service: Endoscopy;  Laterality: N/A;  1200-rescheduled to 9/12 @ 1:00   POLYPECTOMY  03/08/2017   Procedure: POLYPECTOMY;  Surgeon: Anita Hippo, MD;  [facility];  Service: Endoscopy;;   WISDOM TOOTH EXTRACTION     Social History   Socioeconomic History   Marital status: Married    Spouse name: Not on file   Number of children: 3   Years of education: Not on file   Highest education level: Not on file  Occupational History   Occupation: EMPLOYED  Tobacco Use   Smoking status: Never   Smokeless tobacco: Never  Vaping Use   Vaping status: Never Used  Substance and Sexual Activity   Alcohol use: No   Drug use: No   Sexual activity: Yes  Other Topics Concern   Not on file  Social History Narrative   Not on file   Social Determinants of Health   Financial Resource Strain: Not on file  Food Insecurity: Not on file  Transportation Needs: Not on file  Physical Activity: Not on file   Stress: Not on file  Social Connections: Unknown (10/03/2022)   Received from [employer], [facility]   Social Network    Social Network: Not on file   Family History  Problem Relation Age of Onset   Stroke Mother    Lung cancer Mother    Heart disease Father    Heart attack Father    Healthy Other    Healthy Other    Diabetes Brother    Heart attack Brother    Hypertension Sister    Heart attack Maternal Grandfather    Heart attack Paternal Grandfather    Healthy Daughter    Allergies  Allergen Reactions   Aspirin Anaphylaxis   Lisinopril Cough   Nsaids Hives   Crestor [Rosuvastatin]     Patient had severe leg pain on several times with taking medication.  Even after a drug holiday and restarting medication she had leg pain with the medicine and not without   Current Outpatient Medications  Medication Sig Dispense Refill   calcium carbonate (TUMS EX) 750 MG chewable tablet Chew 2 tablets by mouth daily. (Patient not taking: Reported on 04/25/2023)     cetirizine (ZYRTEC) 10 MG  tablet Take 10 mg by mouth daily.     dicyclomine (BENTYL) 20 MG tablet TAKE 1 TABLET BY MOUTH THREE TIMES DAILY AS NEEDED ABDOMINAL  CRAMPING 30 tablet 1   docusate sodium (COLACE) 100 MG capsule Take 2 capsules (200 mg total) by mouth daily. 60 capsule 0   Evolocumab (REPATHA) 140 MG/ML SOSY 140 mg subcutaneous every 2 weeks 2 mL 5   fluticasone (FLONASE) 50 MCG/ACT nasal spray Place 1 spray into both nostrils daily.     hydrochlorothiazide (HYDRODIURIL) 25 MG tablet Take 1 tablet by mouth once daily 90 tablet 0   Magnesium 100 MG TABS Take by mouth.     Multiple Vitamin (MULTIVITAMIN) tablet Take 1 tablet by mouth daily.     Omeprazole Magnesium (PRILOSEC OTC PO) Take by mouth daily at 6 (six) AM.     ondansetron (ZOFRAN) 8 MG tablet Take 1 tablet (8 mg total) by mouth every 8 (eight) hours as needed for nausea. 15 tablet 1   OVER THE COUNTER MEDICATION B 12 once 1000 mcg daily  D3 2000 iu   daily.     potassium chloride (KLOR-CON) 10 MEQ tablet TAKE 2 TABLETS BY MOUTH IN THE MORNING AND 1 TABLET  IN THE AFTERNOON (Patient taking differently: 2 every morning.) 90 tablet 0   simethicone (MYLICON) 125 MG chewable tablet Chew 250 mg by mouth at bedtime.     No current facility-administered medications for this visit.   No results found.  Review of Systems:   A ROS was performed including pertinent positives and negatives as documented in the HPI.  Physical Exam :   Constitutional: NAD and appears stated age Neurological: Alert and oriented Psych: Appropriate affect and cooperative Last menstrual period 09/18/2012.   Comprehensive Musculoskeletal Exam:    Active range of motion of the right knee from 0 to 130 degrees without significant crepitus.  Tenderness over the medial joint line.  Trace effusion.  No instability with varus or valgus stress.  5 out of 5 knee flexion and extension strength.  Imaging:   Xray (right knee 4 views): Joint space narrowing noted of the medial compartment as well as patellofemoral osteophytes reflecting mild to moderate osteoarthritis   I personally reviewed and interpreted the radiographs.   Assessment:   62 y.o. female with 6-week history of atraumatic knee pain.  Radiographs today do show evidence of osteoarthritis mainly in the medial and patellofemoral components.  She has already been on a course of prednisone and has allergies to NSAIDs so in discussion of treatment options I have recommended a cortisone injection.  After consideration patient would like to proceed with this today and cortisone injection was performed of the right knee.  She tolerated this procedure very well.  Will plan to assess relief and we will see her back as needed.  Plan :    -Right knee cortisone injection performed today after patient consent -Return to clinic as needed    Procedure Note  Patient: Anita Shea             Date of Birth:  1960-08-19           MRN: [id number]             Visit Date: 04/28/2023  Procedures: Visit Diagnoses:  1. Acute pain of right knee     Large Joint Inj: R knee on 04/28/2023 6:15 PM Indications: pain Details: 22 G 1.5 in needle, anterolateral approach Medications: 4 mL lidocaine 1 %; 2  mL triamcinolone acetonide 40 MG/ML Outcome: tolerated well, no immediate complications Procedure, treatment alternatives, risks and benefits explained, specific risks discussed. Consent was given by the patient. Immediately prior to procedure a time out was called to verify the correct patient, procedure, equipment, support staff and site/side marked as required. Patient was prepped and draped in the usual sterile fashion.      I personally saw and evaluated the patient, and participated in the management and treatment plan.  Hazle Nordmann, PA-C Orthopedics

## 2023-04-30 MED ORDER — LIDOCAINE HCL 1 % IJ SOLN
4.0000 mL | INTRAMUSCULAR | Status: AC | PRN
  Administered 2023-04-28: 4 mL

## 2023-04-30 MED ORDER — TRIAMCINOLONE ACETONIDE 40 MG/ML IJ SUSP
2.0000 mL | INTRAMUSCULAR | Status: AC | PRN
  Administered 2023-04-28: 2 mL via INTRA_ARTICULAR

## 2023-05-04 ENCOUNTER — Encounter (HOSPITAL_BASED_OUTPATIENT_CLINIC_OR_DEPARTMENT_OTHER): Payer: Self-pay

## 2023-05-05 ENCOUNTER — Telehealth: Payer: Self-pay | Admitting: Student

## 2023-05-05 NOTE — Telephone Encounter (Signed)
Patient calling, requesting copy of xray on CD from 04/28/23. Please call when ready [phone]

## 2023-05-09 NOTE — Telephone Encounter (Signed)
Patient aware CD is ready for pickup at front desk.  

## 2023-05-12 ENCOUNTER — Telehealth (INDEPENDENT_AMBULATORY_CARE_PROVIDER_SITE_OTHER): Payer: Self-pay | Admitting: *Deleted

## 2023-05-12 DIAGNOSIS — Z1211 Encounter for screening for malignant neoplasm of colon: Secondary | ICD-10-CM

## 2023-05-12 MED ORDER — PEG 3350-KCL-NA BICARB-NACL 420 G PO SOLR: 4000.0000 mL | Freq: Once | ORAL | 0 refills | Status: AC

## 2023-05-12 NOTE — Telephone Encounter (Signed)
TCS was ordered and was having issues with let and wanted to wait and now she is ready to get scheduled. [phone]

## 2023-05-12 NOTE — Telephone Encounter (Signed)
Pt contacted. Pt scheduled for 06/27/23 with Dr.Castaneda at 12:45pm. Prep sent to pharmacy. Instructions will be mailed once telephone pre op has been received. No Pa needed per insurance. Pt was told that this would need to be ordered as preventative.   FYI-Pt also wanted a note is chart that she does not tolerate anesthesia well and will need nausea med when she goes in for procedure.

## 2023-05-17 NOTE — Telephone Encounter (Signed)
Please let Bethanie know that currently under her insurance company guidelines we are having a very difficult time getting this medication approved.  I will be continuing working with the clinical pharmacist regarding this issue and looking at alternative options  Once I find this out then I will communicate back with Dabria thanks

## 2023-05-18 ENCOUNTER — Other Ambulatory Visit: Payer: Self-pay

## 2023-05-18 ENCOUNTER — Ambulatory Visit: Payer: BC Managed Care – PPO | Admitting: Dermatology

## 2023-05-18 DIAGNOSIS — Z79899 Other long term (current) drug therapy: Secondary | ICD-10-CM

## 2023-05-18 DIAGNOSIS — E7849 Other hyperlipidemia: Secondary | ICD-10-CM

## 2023-05-18 MED ORDER — PRAVASTATIN SODIUM 20 MG PO TABS: 20.0000 mg | ORAL_TABLET | Freq: Every day | ORAL | 1 refills | Status: DC

## 2023-05-18 NOTE — Telephone Encounter (Signed)
Nurses May try pravastatin 20 mg 1 each evening #90 with 1 refill Check lab work approximately 8 to 10 weeks after starting the medicine including a lipid and liver  Also you may share this message with [place]  Most insurance protocols require that a person failed 2 separate statins before they will consider other medications Also some insurances unfortunately will not consider Repatha or Praluent unless a person has had a heart attack or stroke and cannot tolerate statins It would be safe to try different statin occasionally folks will have side effects with 1 but not with others Feel free to give feedback regarding any significant myalgias arthralgias etc. Follow-up lab work 8 to 10 weeks  The goal is to get LDL below 70 if possible it would not surprise me we will have to go up on the dose but a good starting dose would be 20 mg These keep [place] posted thanks-Bonham Zingale

## 2023-06-02 ENCOUNTER — Telehealth: Payer: Self-pay | Admitting: Pharmacist

## 2023-06-02 NOTE — Telephone Encounter (Signed)
I'm trying one more time to get her repatha! Just submitted CT/CAC scoring info  She could always try zetia (not as great LDL lowering) or 1-2 times weekly statin if she has not tried that.  Patients really do well on once weekly statin and there is some benefit (especially with rosuvastatin). Her insurance will cover Repatha, however in the cases where no ASCVD is present and it's just a CAC score, they don't recognize the necessity of a PCSK9 unless LDL is >190.  I know the time will come when they will accept CAC scores.  Hope this is helpful! Raynelle Fanning

## 2023-06-05 ENCOUNTER — Encounter: Payer: Self-pay | Admitting: Family Medicine

## 2023-06-05 NOTE — Telephone Encounter (Signed)
MyChart message asking for patient's feedback was sent to the patient-FYI

## 2023-06-06 ENCOUNTER — Other Ambulatory Visit: Payer: Self-pay | Admitting: Family Medicine

## 2023-06-06 DIAGNOSIS — I10 Essential (primary) hypertension: Secondary | ICD-10-CM

## 2023-06-07 NOTE — Telephone Encounter (Signed)
Rec' faxed Appeals Form from [facility] [employer] Re: Repatha. It is still DENIED.

## 2023-06-09 ENCOUNTER — Encounter: Payer: Self-pay | Admitting: Family Medicine

## 2023-06-09 ENCOUNTER — Telehealth: Payer: Self-pay | Admitting: Family Medicine

## 2023-06-09 NOTE — Telephone Encounter (Signed)
Patient reached out stating that she is having significant troubles with her knee was seen by [facility] she brought by MRI report as well as office visit notes for me to review and give her a call  I did send her an MyChart message I will call her on Saturday late afternoon-patient aware

## 2023-06-11 NOTE — Telephone Encounter (Signed)
I did discuss this with the patient she will be following up with [facility] She has severe osteoarthritis as well as a torn meniscus may end up needing to have surgery she also has a insufficiency fracture that is being treated by nonweightbearing

## 2023-06-22 ENCOUNTER — Other Ambulatory Visit (HOSPITAL_COMMUNITY)
Admission: RE | Admit: 2023-06-22 | Discharge: 2023-06-22 | Disposition: A | Discharge status: Home / Self Care | Payer: BC Managed Care – PPO | Source: Ambulatory Visit | Attending: Gastroenterology | Admitting: Gastroenterology

## 2023-06-22 DIAGNOSIS — Z1211 Encounter for screening for malignant neoplasm of colon: Secondary | ICD-10-CM | POA: Insufficient documentation

## 2023-06-22 LAB — BASIC METABOLIC PANEL
Anion gap: 9 (ref 5–15)
BUN: 8 mg/dL (ref 8–23)
CO2: 27 mmol/L (ref 22–32)
Calcium: 9.4 mg/dL (ref 8.9–10.3)
Chloride: 100 mmol/L (ref 98–111)
Creatinine, Ser: 0.55 mg/dL (ref 0.44–1.00)
GFR, Estimated: >60 mL/min (ref >60)
Glucose, Bld: 130 mg/dL — ABNORMAL HIGH (ref 70–99)
Potassium: 3 mmol/L — ABNORMAL LOW (ref 3.5–5.1)
Sodium: 136 mmol/L (ref 135–145)

## 2023-06-23 ENCOUNTER — Telehealth (INDEPENDENT_AMBULATORY_CARE_PROVIDER_SITE_OTHER): Payer: Self-pay | Admitting: *Deleted

## 2023-06-23 ENCOUNTER — Encounter (HOSPITAL_COMMUNITY): Payer: Self-pay

## 2023-06-23 ENCOUNTER — Encounter (HOSPITAL_COMMUNITY)
Admission: RE | Admit: 2023-06-23 | Discharge: 2023-06-23 | Disposition: A | Discharge status: Home / Self Care | Payer: BC Managed Care – PPO | Source: Ambulatory Visit | Attending: Gastroenterology | Admitting: Gastroenterology

## 2023-06-23 DIAGNOSIS — I10 Essential (primary) hypertension: Secondary | ICD-10-CM

## 2023-06-23 HISTORY — DX: Other specified postprocedural states: R11.2

## 2023-06-23 HISTORY — DX: Other specified postprocedural states: Z98.890

## 2023-06-23 NOTE — Telephone Encounter (Signed)
Patient left vm that she was suppose to have pre op call today at 9:30 and has not received a call. Would like to know how to reach the hospital for her pre op  [phone]

## 2023-06-25 ENCOUNTER — Encounter (INDEPENDENT_AMBULATORY_CARE_PROVIDER_SITE_OTHER): Payer: Self-pay | Admitting: Gastroenterology

## 2023-06-26 ENCOUNTER — Encounter (INDEPENDENT_AMBULATORY_CARE_PROVIDER_SITE_OTHER): Payer: Self-pay

## 2023-06-26 NOTE — Telephone Encounter (Signed)
Mychart message sent to patient.

## 2023-06-27 ENCOUNTER — Encounter (HOSPITAL_COMMUNITY): Payer: Self-pay | Admitting: Gastroenterology

## 2023-06-27 ENCOUNTER — Ambulatory Visit (HOSPITAL_COMMUNITY): Payer: Self-pay | Admitting: Anesthesiology

## 2023-06-27 ENCOUNTER — Ambulatory Visit (HOSPITAL_COMMUNITY)
Admission: RE | Admit: 2023-06-27 | Discharge: 2023-06-27 | Disposition: A | Discharge status: Home / Self Care | Payer: BC Managed Care – PPO | Attending: Gastroenterology | Admitting: Gastroenterology

## 2023-06-27 ENCOUNTER — Encounter (HOSPITAL_COMMUNITY)
Admission: RE | Disposition: A | Discharge status: Home / Self Care | Payer: Self-pay | Source: Home / Self Care | Attending: Gastroenterology

## 2023-06-27 DIAGNOSIS — I10 Essential (primary) hypertension: Secondary | ICD-10-CM | POA: Diagnosis not present

## 2023-06-27 DIAGNOSIS — K5730 Diverticulosis of large intestine without perforation or abscess without bleeding: Secondary | ICD-10-CM | POA: Insufficient documentation

## 2023-06-27 DIAGNOSIS — K219 Gastro-esophageal reflux disease without esophagitis: Secondary | ICD-10-CM | POA: Insufficient documentation

## 2023-06-27 DIAGNOSIS — D123 Benign neoplasm of transverse colon: Secondary | ICD-10-CM | POA: Diagnosis not present

## 2023-06-27 DIAGNOSIS — Z79899 Other long term (current) drug therapy: Secondary | ICD-10-CM | POA: Diagnosis not present

## 2023-06-27 DIAGNOSIS — K5732 Diverticulitis of large intestine without perforation or abscess without bleeding: Secondary | ICD-10-CM

## 2023-06-27 DIAGNOSIS — Z8719 Personal history of other diseases of the digestive system: Secondary | ICD-10-CM

## 2023-06-27 DIAGNOSIS — D120 Benign neoplasm of cecum: Secondary | ICD-10-CM | POA: Diagnosis not present

## 2023-06-27 DIAGNOSIS — D122 Benign neoplasm of ascending colon: Secondary | ICD-10-CM | POA: Insufficient documentation

## 2023-06-27 HISTORY — PX: COLONOSCOPY WITH PROPOFOL: SHX5780

## 2023-06-27 HISTORY — PX: POLYPECTOMY: SHX5525

## 2023-06-27 LAB — HM COLONOSCOPY

## 2023-06-27 SURGERY — COLONOSCOPY WITH PROPOFOL
Anesthesia: General

## 2023-06-27 MED ORDER — STERILE WATER FOR IRRIGATION IR SOLN
Status: DC | PRN
  Administered 2023-06-27: 60 mL

## 2023-06-27 MED ORDER — LACTATED RINGERS IV SOLN: INTRAVENOUS | Status: DC

## 2023-06-27 MED ORDER — PROPOFOL 500 MG/50ML IV EMUL
INTRAVENOUS | Status: DC | PRN
  Administered 2023-06-27: 150 ug/kg/min via INTRAVENOUS

## 2023-06-27 MED ORDER — PROPOFOL 10 MG/ML IV BOLUS
INTRAVENOUS | Status: DC | PRN
  Administered 2023-06-27: 100 mg via INTRAVENOUS

## 2023-06-27 MED ORDER — LIDOCAINE HCL (CARDIAC) PF 100 MG/5ML IV SOSY
PREFILLED_SYRINGE | INTRAVENOUS | Status: DC | PRN
  Administered 2023-06-27: 50 mg via INTRATRACHEAL

## 2023-06-27 NOTE — Op Note (Signed)
 [facility] Patient Name: Anita Shea Procedure Date: 06/27/2023 12:04 PM MRN: [id number] Date of Birth: Nov 24, 1960 Attending MD: Toribio Fortune , , [id number] CSN: [id number] Age: 62 Admit Type: Outpatient Procedure:                Colonoscopy Indications:              Follow-up of diverticulitis Providers:                Toribio Fortune, Devere Lodge, Daphne Mulch                            Technician, Technician Referring MD:             Toribio Fortune Medicines:                Monitored Anesthesia Care Complications:            No immediate complications. Estimated Blood Loss:     Estimated blood loss: none. Procedure:                Pre-Anesthesia Assessment:                           - Prior to the procedure, a History and Physical                            was performed, and patient medications, allergies                            and sensitivities were reviewed. The patient's                            tolerance of previous anesthesia was reviewed.                           - The risks and benefits of the procedure and the                            sedation options and risks were discussed with the                            patient. All questions were answered and informed                            consent was obtained.                           - ASA Grade Assessment: II - A patient with mild                            systemic disease.                           After obtaining informed consent, the colonoscope                            was passed under direct vision. Throughout the  procedure, the patient's blood pressure, pulse, and                            oxygen saturations were monitored continuously. The                            [id number]-HQ190L ([id number]) scope was introduced through                            the anus and advanced to the the cecum, identified                            by appendiceal orifice and ileocecal valve.  The                            colonoscopy was performed without difficulty. The                            patient tolerated the procedure well. The quality                            of the bowel preparation was good. Scope In: 12:59:11 PM Scope Out: 1:22:52 PM Scope Withdrawal Time: 0 hours 14 minutes 26 seconds  Total Procedure Duration: 0 hours 23 minutes 41 seconds  Findings:      The perianal and digital rectal examinations were normal.      Three sessile polyps were found in the transverse colon, ascending colon       and cecum. The polyps were 4 to 8 mm in size. These polyps were removed       with a cold snare. Resection and retrieval were complete.      Scattered medium-mouthed and small-mouthed diverticula were found in the       sigmoid colon, descending colon and ascending colon.      The retroflexed view of the distal rectum and anal verge was normal and       showed no anal or rectal abnormalities. Impression:               - Three 4 to 8 mm polyps in the transverse colon,                            in the ascending colon and in the cecum, removed                            with a cold snare. Resected and retrieved.                           - Diverticulosis in the sigmoid colon, in the                            descending colon and in the ascending colon.                           - The distal rectum and anal verge are normal on  retroflexion view. Moderate Sedation:      Per Anesthesia Care Recommendation:           - Discharge patient to home (ambulatory).                           - Resume previous diet.                           - Await pathology results.                           - Repeat colonoscopy date to be determined after                            pending pathology results are reviewed for                            surveillance. Procedure Code(s):        --- Professional ---                           [phone], Colonoscopy,  flexible; with removal of                            tumor(s), polyp(s), or other lesion(s) by snare                            technique Diagnosis Code(s):        --- Professional ---                           D12.3, Benign neoplasm of transverse colon (hepatic                            flexure or splenic flexure)                           D12.2, Benign neoplasm of ascending colon                           D12.0, Benign neoplasm of cecum                           K57.32, Diverticulitis of large intestine without                            perforation or abscess without bleeding                           K57.30, Diverticulosis of large intestine without                            perforation or abscess without bleeding CPT copyright 2022 American Medical Association. All rights reserved. The codes documented in this report are preliminary and upon coder review may  be revised to meet current compliance requirements. Toribio Fortune, MD Toribio Fortune,  06/27/2023 1:36:32 PM This report has  been signed electronically. Number of Addenda: 0

## 2023-06-27 NOTE — Anesthesia Preprocedure Evaluation (Signed)
Anesthesia Evaluation  Patient identified by MRN, date of birth, ID band Patient awake    Reviewed: Allergy & Precautions, H&P , NPO status , Patient's Chart, lab work & pertinent test results, reviewed documented beta blocker date and time   History of Anesthesia Complications (+) PONV and history of anesthetic complications  Airway Mallampati: II  TM Distance: >3 FB Neck ROM: full    Dental no notable dental hx.    Pulmonary neg pulmonary ROS   Pulmonary exam normal breath sounds clear to auscultation       Cardiovascular Exercise Tolerance: Good hypertension, negative cardio ROS  Rhythm:regular Rate:Normal     Neuro/Psych negative neurological ROS  negative psych ROS   GI/Hepatic negative GI ROS, Neg liver ROS,GERD  ,,  Endo/Other  negative endocrine ROS    Renal/GU negative Renal ROS  negative genitourinary   Musculoskeletal   Abdominal   Peds  Hematology negative hematology ROS (+)   Anesthesia Other Findings   Reproductive/Obstetrics negative OB ROS                             Anesthesia Physical Anesthesia Plan  ASA: 2  Anesthesia Plan: General   Post-op Pain Management:    Induction:   PONV Risk Score and Plan: Propofol infusion  Airway Management Planned:   Additional Equipment:   Intra-op Plan:   Post-operative Plan:   Informed Consent: I have reviewed the patients History and Physical, chart, labs and discussed the procedure including the risks, benefits and alternatives for the proposed anesthesia with the patient or authorized representative who has indicated his/her understanding and acceptance.     Dental Advisory Given  Plan Discussed with: CRNA  Anesthesia Plan Comments:        Anesthesia Quick Evaluation

## 2023-06-27 NOTE — Discharge Instructions (Signed)
 You are being discharged to home.  Resume your previous diet.  We are waiting for your pathology results.  Your physician has recommended a repeat colonoscopy (date to be determined after pending pathology results are reviewed) for surveillance.

## 2023-06-27 NOTE — Anesthesia Postprocedure Evaluation (Signed)
 Anesthesia Post Note  Patient: Anita Shea  Procedure(s) Performed: COLONOSCOPY WITH PROPOFOL  POLYPECTOMY  Patient location during evaluation: Short Stay Anesthesia Type: General Level of consciousness: awake and alert Pain management: pain level controlled Vital Signs Assessment: post-procedure vital signs reviewed and stable Respiratory status: spontaneous breathing Cardiovascular status: blood pressure returned to baseline and stable Postop Assessment: no apparent nausea or vomiting Anesthetic complications: no   No notable events documented.   Last Vitals:  Vitals:   06/27/23 1145  BP: (!) 144/77  Pulse: 81  Resp: 18  Temp: 36.8 C  SpO2: 95%    Last Pain:  Vitals:   06/27/23 1254  PainSc: 0-No pain                 Connar Keating

## 2023-06-27 NOTE — Transfer of Care (Signed)
 Immediate Anesthesia Transfer of Care Note  Patient: Anita Shea  Procedure(s) Performed: COLONOSCOPY WITH PROPOFOL  POLYPECTOMY  Patient Location: Short Stay  Anesthesia Type:General  Level of Consciousness: awake  Airway & Oxygen Therapy: Patient Spontanous Breathing  Post-op Assessment: Report given to RN  Post vital signs: Reviewed and stable  Last Vitals:  Vitals Value Taken Time  BP    Temp    Pulse    Resp    SpO2      Last Pain:  Vitals:   06/27/23 1254  PainSc: 0-No pain      Patients Stated Pain Goal: 6 (06/27/23 1145)  Complications: No notable events documented.

## 2023-06-27 NOTE — H&P (Signed)
 Anita Shea is an 62 y.o. female.   Chief Complaint: history diverticulitis HPI: Anita Shea is a 62 y.o. femalewith past medical history of previous diverticulitis x3, GERD, hypertension,  who presents for evaluation of recurrent diverticulitis.   Past Medical History:  Diagnosis Date   Complication of anesthesia    Diverticulitis of colon 08/30/2016   Hypertension    PONV (postoperative nausea and vomiting)     Past Surgical History:  Procedure Laterality Date   CHOLECYSTECTOMY     1993, flares   COLONOSCOPY N/A 03/08/2017   Procedure: COLONOSCOPY;  Surgeon: Golda Claudis PENNER, MD;  Location: [facility];  Service: Endoscopy;  Laterality: N/A;  1200-rescheduled to 9/12 @ 1:00   POLYPECTOMY  03/08/2017   Procedure: POLYPECTOMY;  Surgeon: Golda Claudis PENNER, MD;  Location: [facility];  Service: Endoscopy;;   WISDOM TOOTH EXTRACTION      Family History  Problem Relation Age of Onset   Stroke Mother    Lung cancer Mother    Heart disease Father    Heart attack Father    Healthy Other    Healthy Other    Diabetes Brother    Heart attack Brother    Hypertension Sister    Heart attack Maternal Grandfather    Heart attack Paternal Grandfather    Healthy Daughter    Social History:  reports that she has never smoked. She has never used smokeless tobacco. She reports that she does not drink alcohol and does not use drugs.  Allergies:  Allergies  Allergen Reactions   Aspirin Anaphylaxis   Lisinopril Cough   Nsaids Hives   Crestor  [Rosuvastatin ]     Patient had severe leg pain on several times with taking medication.  Even after a drug holiday and restarting medication she had leg pain with the medicine and not without    Medications Prior to Admission  Medication Sig Dispense Refill   cetirizine (ZYRTEC) 10 MG tablet Take 10 mg by mouth daily.     dicyclomine  (BENTYL ) 20 MG tablet TAKE 1 TABLET BY MOUTH THREE TIMES DAILY AS NEEDED ABDOMINAL  CRAMPING 30 tablet 1    docusate sodium  (COLACE) 100 MG capsule Take 2 capsules (200 mg total) by mouth daily. 60 capsule 0   Evolocumab  (REPATHA ) 140 MG/ML SOSY 140 mg subcutaneous every 2 weeks 2 mL 5   fluticasone (FLONASE) 50 MCG/ACT nasal spray Place 1 spray into both nostrils daily.     hydrochlorothiazide  (HYDRODIURIL ) 25 MG tablet Take 1 tablet by mouth once daily 90 tablet 0   Magnesium  100 MG TABS Take by mouth.     Multiple Vitamin (MULTIVITAMIN) tablet Take 1 tablet by mouth daily.     Omeprazole  Magnesium  (PRILOSEC  OTC PO) Take by mouth daily at 6 (six) AM.     ondansetron  (ZOFRAN ) 8 MG tablet Take 1 tablet (8 mg total) by mouth every 8 (eight) hours as needed for nausea. 15 tablet 1   potassium chloride  (KLOR-CON ) 10 MEQ tablet TAKE 2 TABLETS BY MOUTH IN THE MORNING AND 1 TAB IN THE AFTERNOON. 90 tablet 0   pravastatin  (PRAVACHOL ) 20 MG tablet Take 1 tablet (20 mg total) by mouth daily. 90 tablet 1   calcium  carbonate (TUMS EX) 750 MG chewable tablet Chew 2 tablets by mouth daily. (Patient not taking: Reported on 04/25/2023)     OVER THE COUNTER MEDICATION B 12 once 1000 mcg daily  D3 2000 iu  daily.     simethicone  (MYLICON)  125 MG chewable tablet Chew 250 mg by mouth at bedtime.      No results found for this or any previous visit (from the past 48 hours). No results found.  Review of Systems  All other systems reviewed and are negative.   Blood pressure (!) 144/77, pulse 81, temperature 98.2 F (36.8 C), resp. rate 18, last menstrual period 09/18/2012, SpO2 95%. Physical Exam  GENERAL: The patient is AO x3, in no acute distress. HEENT: Head is normocephalic and atraumatic. EOMI are intact. Mouth is well hydrated and without lesions. NECK: Supple. No masses LUNGS: Clear to auscultation. No presence of rhonchi/wheezing/rales. Adequate chest expansion HEART: RRR, normal s1 and s2. ABDOMEN: Soft, nontender, no guarding, no peritoneal signs, and nondistended. BS +. No masses. EXTREMITIES:  Without any cyanosis, clubbing, rash, lesions or edema. NEUROLOGIC: AOx3, no focal motor deficit. SKIN: no jaundice, no rashes  Assessment/Plan Anita Shea is a 62 y.o. femalewith past medical history of previous diverticulitis x3, GERD, hypertension,  who presents for evaluation of recurrent diverticulitis. Will proceed with colonoscopy.  Toribio Eartha Flavors, MD 06/27/2023, 12:50 PM

## 2023-06-28 ENCOUNTER — Encounter: Payer: Self-pay | Admitting: Family Medicine

## 2023-06-29 ENCOUNTER — Encounter (INDEPENDENT_AMBULATORY_CARE_PROVIDER_SITE_OTHER): Payer: Self-pay | Admitting: *Deleted

## 2023-06-29 NOTE — Telephone Encounter (Signed)
 Nurses I was able to review over her message as well as the results of her lab work and her her colonoscopy  The colonoscopy overall looked good there were some polyps biopsies were taken awaiting the results  Potassium was low I saw what Dr. Eartha recommended.  At this point I would recommend taking potassium 10 mill equivalents 2 in the morning and 2 in the evening #120 with 4 refills May continue her other medicines as is  Repeat metabolic 7 and magnesium  in approximately 7 to 10 days  Keep all regular follow-up visits  Thanks-Dr. Glendia

## 2023-06-30 ENCOUNTER — Other Ambulatory Visit: Payer: Self-pay

## 2023-06-30 DIAGNOSIS — E876 Hypokalemia: Secondary | ICD-10-CM

## 2023-06-30 LAB — SURGICAL PATHOLOGY

## 2023-06-30 MED ORDER — POTASSIUM CHLORIDE CRYS ER 10 MEQ PO TBCR: EXTENDED_RELEASE_TABLET | ORAL | 4 refills | Status: DC

## 2023-07-02 ENCOUNTER — Other Ambulatory Visit: Payer: Self-pay | Admitting: Family Medicine

## 2023-07-02 DIAGNOSIS — I10 Essential (primary) hypertension: Secondary | ICD-10-CM

## 2023-07-03 ENCOUNTER — Encounter (INDEPENDENT_AMBULATORY_CARE_PROVIDER_SITE_OTHER): Payer: Self-pay | Admitting: *Deleted

## 2023-07-03 MED ORDER — POTASSIUM CHLORIDE ER 10 MEQ PO TBCR: EXTENDED_RELEASE_TABLET | ORAL | 0 refills | Status: DC

## 2023-07-04 ENCOUNTER — Other Ambulatory Visit: Payer: Self-pay | Admitting: Family Medicine

## 2023-07-04 MED ORDER — POTASSIUM CHLORIDE CRYS ER 20 MEQ PO TBCR: 20.0000 meq | EXTENDED_RELEASE_TABLET | Freq: Two times a day (BID) | ORAL | 6 refills | Status: DC

## 2023-07-07 ENCOUNTER — Encounter (HOSPITAL_COMMUNITY): Payer: Self-pay | Admitting: Gastroenterology

## 2023-07-15 LAB — MAGNESIUM: Magnesium: 2 mg/dL (ref 1.6–2.3)

## 2023-07-15 LAB — BASIC METABOLIC PANEL
BUN/Creatinine Ratio: 15 (ref 12–28)
BUN: 11 mg/dL (ref 8–27)
CO2: 25 mmol/L (ref 20–29)
Calcium: 9.9 mg/dL (ref 8.7–10.3)
Chloride: 97 mmol/L (ref 96–106)
Creatinine, Ser: 0.75 mg/dL (ref 0.57–1.00)
Glucose: 109 mg/dL — ABNORMAL HIGH (ref 70–99)
Potassium: 4 mmol/L (ref 3.5–5.2)
Sodium: 138 mmol/L (ref 134–144)
eGFR: 91 mL/min/{1.73_m2} (ref >59)

## 2023-07-16 ENCOUNTER — Encounter: Payer: Self-pay | Admitting: Family Medicine

## 2023-08-22 ENCOUNTER — Ambulatory Visit: Payer: 59 | Admitting: Dermatology

## 2023-08-22 ENCOUNTER — Encounter: Payer: Self-pay | Admitting: Dermatology

## 2023-08-22 VITALS — BP 141/91

## 2023-08-22 DIAGNOSIS — L700 Acne vulgaris: Secondary | ICD-10-CM

## 2023-08-22 DIAGNOSIS — L988 Other specified disorders of the skin and subcutaneous tissue: Secondary | ICD-10-CM | POA: Diagnosis not present

## 2023-08-22 DIAGNOSIS — L709 Acne, unspecified: Secondary | ICD-10-CM | POA: Diagnosis not present

## 2023-08-22 MED ORDER — TRETINOIN 0.025 % EX CREA: TOPICAL_CREAM | CUTANEOUS | 0 refills | Status: DC

## 2023-08-22 NOTE — Progress Notes (Signed)
   New Patient Visit   Subjective  Anita Shea is a 63 y.o. female who presents for the following: New Pt - Bump on Nose  Patient states she has bump located at the tip of her nose that she would like to have examined. Patient reports the areas had been there for 3 weeks before it cleared but pt kept her OV as she still wanted the spot looked at. She reports the area are not bothersome.Patient rates irritation 0 out of 10. She states that the areas have not spread. Patient reports she has not previously been treated for these areas. Patient denied Hx of bx. Patient denies family history of skin cancer(s).   The following portions of the chart were reviewed this encounter and updated as appropriate: medications, allergies, medical history  Review of Systems:  No other skin or systemic complaints except as noted in HPI or Assessment and Plan.  Objective  Well appearing patient in no apparent distress; mood and affect are within normal limits.   A focused examination was performed of the following areas: face   Relevant exam findings are noted in the Assessment and Plan.    Assessment & Plan   ANTI-AGING & ACNE - Assessment: Patient seeking advice on skin care for maintaining youthful appearance and preventing wrinkles. Currently using Revitalift, an over-the-counter retinol product.  - Plan:    Prescribe tretinoin 0.025% cream    Instructions for tretinoin use: Apply pea-sized amount two nights a week for the first month, then increase to three nights a week    Reapply sunscreen every three hours when in sun    Provide samples of recommended products    Recommend oral collagen supplement (e.g., Vital Proteins brand) for additional collagen boost    Follow-up appointment in August to assess tretinoin tolerance and potentially adjust treatment plan    Consider future additions to skincare regimen (vitamin C, growth factors, peptides) once skin is acclimated to tretinoin  2.  Transient skin lesion on nose - Assessment: Patient reported a spot on the nose that has since resolved. Transient nature suggests it may have been a benign lesion such as a blemish.  - Plan:    Monitor for recurrence of the lesion    If lesion returns and persists, schedule a spot check appointment   No follow-ups on file.    Documentation: I have reviewed the above documentation for accuracy and completeness, and I agree with the above.   I, Shirron Marcha Solders, CMA, am acting as scribe for [employer], DO.   Langston Reusing, DO

## 2023-08-22 NOTE — Patient Instructions (Addendum)
 Hello [place],  Thank you for visiting [place] today.  Here is a summary of the key instructions from today's consultation:  - Observation of Skin Changes: Monitor any recurring spots on your nose. If a spot returns and persists, contact us for a quick evaluation.  - Sunscreen Use: Apply sunscreen every three hours, especially when exposed to sunlight.  - Retinol Application: Continue using over-the-counter retinol products like Revitalift.   - Prescription Option: If desired, we can prescribe tretinoin 0.025% cream.   - Usage: Start with two nights a week for the first month, then increase to three nights a week.  - Daily Skincare Routine:   - Morning: Wash your face, apply hyaluronic acid, and then a moisturizer with sunscreen.   - Evening: Wash your face, apply hyaluronic acid, and on designated nights, use a pea-sized amount of tretinoin followed by a moisturizer like CeraVe.  - Product Rotation: Rotate between different skincare products, giving each a full week before switching.  - Follow-Up Appointment: Schedule a follow-up in August to assess your tolerance to tretinoin and discuss potential adjustments or additions to your regimen.  - Collagen Supplements: Consider taking oral collagen supplements, such as Vital Proteins, to enhance skin collagen thickness.  - Samples: We have provided you with some samples to try out the new regimen.  We look forward to seeing your progress by your next appointment in August. If you have any questions or concerns before then, please do not hesitate to contact our office.  Warm regards,  Dr. Langston Reusing, Dermatology Important Information  Due to recent changes in healthcare laws, you may see results of your pathology and/or laboratory studies on MyChart before the doctors have had a chance to review them. We understand that in some cases there may be results that are confusing or concerning to you. Please understand that not all results are  received at the same time and often the doctors may need to interpret multiple results in order to provide you with the best plan of care or course of treatment. Therefore, we ask that you please give [place] 2 business days to thoroughly review all your results before contacting the office for clarification. Should we see a critical lab result, you will be contacted sooner.   If You Need Anything After Your Visit  If you have any questions or concerns for your doctor, please call our main line at [phone] If no one answers, please leave a voicemail as directed and we will return your call as soon as possible. Messages left after 4 pm will be answered the following business day.   You may also send [place] a message via MyChart. We typically respond to MyChart messages within 1-2 business days.  For prescription refills, please ask your pharmacy to contact our office. Our fax number is [phone].  If you have an urgent issue when the clinic is closed that cannot wait until the next business day, you can page your doctor at the number below.    Please note that while we do our best to be available for urgent issues outside of office hours, we are not available 24/7.   If you have an urgent issue and are unable to reach [place], you may choose to seek medical care at your doctor's office, retail clinic, urgent care center, or emergency room.  If you have a medical emergency, please immediately call 911 or go to the emergency department. In the event of inclement weather, please call our main line  at [phone] for an update on the status of any delays or closures.  Dermatology Medication Tips: Please keep the boxes that topical medications come in in order to help keep track of the instructions about where and how to use these. Pharmacies typically print the medication instructions only on the boxes and not directly on the medication tubes.   If your medication is too expensive, please contact our  office at [phone] or send [place] a message through MyChart.   We are unable to tell what your co-pay for medications will be in advance as this is different depending on your insurance coverage. However, we may be able to find a substitute medication at lower cost or fill out paperwork to get insurance to cover a needed medication.   If a prior authorization is required to get your medication covered by your insurance company, please allow [place] 1-2 business days to complete this process.  Drug prices often vary depending on where the prescription is filled and some pharmacies may offer cheaper prices.  The website www.goodrx.com contains coupons for medications through different pharmacies. The prices here do not account for what the cost may be with help from insurance (it may be cheaper with your insurance), but the website can give you the price if you did not use any insurance.  - You can print the associated coupon and take it with your prescription to the pharmacy.  - You may also stop by our office during regular business hours and pick up a GoodRx coupon card.  - If you need your prescription sent electronically to a different pharmacy, notify our office through [facility] or by phone at [phone]

## 2023-09-09 ENCOUNTER — Other Ambulatory Visit: Payer: Self-pay | Admitting: Family Medicine

## 2023-09-09 DIAGNOSIS — I10 Essential (primary) hypertension: Secondary | ICD-10-CM

## 2023-09-16 ENCOUNTER — Other Ambulatory Visit: Payer: Self-pay | Admitting: Family Medicine

## 2023-10-04 ENCOUNTER — Other Ambulatory Visit: Payer: Self-pay

## 2023-10-04 ENCOUNTER — Telehealth: Payer: Self-pay | Admitting: Family Medicine

## 2023-10-04 ENCOUNTER — Encounter: Payer: Self-pay | Admitting: Family Medicine

## 2023-10-04 DIAGNOSIS — E7849 Other hyperlipidemia: Secondary | ICD-10-CM

## 2023-10-04 DIAGNOSIS — Z79899 Other long term (current) drug therapy: Secondary | ICD-10-CM

## 2023-10-04 DIAGNOSIS — I10 Essential (primary) hypertension: Secondary | ICD-10-CM

## 2023-10-04 NOTE — Telephone Encounter (Signed)
 Nurses Please order lipid, liver, metabolic 7, urine ACR Diagnosis HTN hyperlipidemia Also you may cancel her appointment with Toni Amend on the 15th we will do her presurgical clearance at the same time as her a regular office visit at the end of the month thank you I am sending a patient a MyChart message on this thanks

## 2023-10-10 ENCOUNTER — Ambulatory Visit: Payer: Self-pay | Admitting: Physician Assistant

## 2023-10-13 ENCOUNTER — Other Ambulatory Visit: Payer: Self-pay | Admitting: Dermatology

## 2023-10-17 ENCOUNTER — Encounter: Payer: Self-pay | Admitting: Family Medicine

## 2023-10-17 LAB — LIPID PANEL
Chol/HDL Ratio: 4.5 ratio — ABNORMAL HIGH (ref 0.0–4.4)
Cholesterol, Total: 229 mg/dL — ABNORMAL HIGH (ref 100–199)
HDL: 51 mg/dL (ref >39)
LDL Chol Calc (NIH): 147 mg/dL — ABNORMAL HIGH (ref 0–99)
Triglycerides: 172 mg/dL — ABNORMAL HIGH (ref 0–149)
VLDL Cholesterol Cal: 31 mg/dL (ref 5–40)

## 2023-10-17 LAB — HEPATIC FUNCTION PANEL
ALT: 20 IU/L (ref 0–32)
AST: 21 IU/L (ref 0–40)
Albumin: 4.5 g/dL (ref 3.9–4.9)
Alkaline Phosphatase: 89 IU/L (ref 44–121)
Bilirubin Total: 0.2 mg/dL (ref 0.0–1.2)
Bilirubin, Direct: 0.08 mg/dL (ref 0.00–0.40)
Total Protein: 7.4 g/dL (ref 6.0–8.5)

## 2023-10-17 LAB — BASIC METABOLIC PANEL WITH GFR
BUN/Creatinine Ratio: 12 (ref 12–28)
BUN: 8 mg/dL (ref 8–27)
CO2: 22 mmol/L (ref 20–29)
Calcium: 9.6 mg/dL (ref 8.7–10.3)
Chloride: 104 mmol/L (ref 96–106)
Creatinine, Ser: 0.68 mg/dL (ref 0.57–1.00)
Glucose: 89 mg/dL (ref 70–99)
Potassium: 4.5 mmol/L (ref 3.5–5.2)
Sodium: 142 mmol/L (ref 134–144)
eGFR: 98 mL/min/{1.73_m2} (ref >59)

## 2023-10-17 LAB — MICROALBUMIN / CREATININE URINE RATIO
Creatinine, Urine: 99.9 mg/dL
Microalb/Creat Ratio: 6 mg/g{creat} (ref 0–29)
Microalbumin, Urine: 6.3 ug/mL

## 2023-10-25 ENCOUNTER — Encounter: Payer: Self-pay | Admitting: Family Medicine

## 2023-10-25 ENCOUNTER — Ambulatory Visit: Payer: BC Managed Care – PPO | Admitting: Family Medicine

## 2023-10-25 VITALS — BP 130/80 | HR 80 | Temp 98.1°F | Ht 67.5 in | Wt 176.0 lb

## 2023-10-25 DIAGNOSIS — E7849 Other hyperlipidemia: Secondary | ICD-10-CM | POA: Diagnosis not present

## 2023-10-25 DIAGNOSIS — M791 Myalgia, unspecified site: Secondary | ICD-10-CM | POA: Diagnosis not present

## 2023-10-25 DIAGNOSIS — R931 Abnormal findings on diagnostic imaging of heart and coronary circulation: Secondary | ICD-10-CM | POA: Diagnosis not present

## 2023-10-25 DIAGNOSIS — I10 Essential (primary) hypertension: Secondary | ICD-10-CM | POA: Diagnosis not present

## 2023-10-25 DIAGNOSIS — M170 Bilateral primary osteoarthritis of knee: Secondary | ICD-10-CM

## 2023-10-25 DIAGNOSIS — T466X5D Adverse effect of antihyperlipidemic and antiarteriosclerotic drugs, subsequent encounter: Secondary | ICD-10-CM

## 2023-10-25 MED ORDER — ONDANSETRON 8 MG PO TBDP: 8.0000 mg | ORAL_TABLET | Freq: Three times a day (TID) | ORAL | 2 refills | Status: DC | PRN

## 2023-10-25 NOTE — Addendum Note (Signed)
 Addended by: Charlotta Cook A on: 10/25/2023 09:14 PM   Modules accepted: Orders

## 2023-10-25 NOTE — Progress Notes (Addendum)
 Subjective:    Patient ID: Anita Shea, female    DOB: [birth year], 63 y.o.   MRN: [id number]  HPI 6 month f/u  hypertension Hyperlipidemia  Discussed the use of AI scribe software for clinical note transcription with the patient, who gave verbal consent to proceed.  History of Present Illness   Anita Shea is a 63 year old female who presents for preoperative evaluation and management of knee replacement surgery.  She is preparing for knee replacement surgery scheduled for Nov 16, 2023, due to significant knee pain that has impacted her ability to walk since September 2024. She last used a bicycle about four weeks ago and has been unable to maintain her usual level of physical activity, affecting her mental well-being. She experiences difficulty sleeping due to knee pain, which sometimes wakes her up at night, but finds that taking Tylenol  helps her sleep through the night.  She is concerned about managing pain postoperatively and is considering the use of oxycodone. She is aware of the potential side effects, including nausea and constipation, and has been advised to use Colace and magnesium  to manage constipation.  She has a history of elevated cholesterol, with an LDL of 147 mg/dL and a total cholesterol of 229 mg/dL as of October 16, 2023. She previously tried pravastatin  but experienced leg pain after three months of use and is not currently on cholesterol medication due to these side effects.  She maintains a healthy diet, avoiding fast food and preparing meals with little to no oil. She has prepared single-serving meals and frozen them in anticipation of her recovery period. She is mindful of portion control, especially with comfort foods like ice cream and hot fudge.  Her family history includes concerns about blood sugar levels, but she currently shows no signs of prediabetes. She is proactive in managing her diet and activity levels to mitigate these risks. No current symptoms  of prediabetes.        Review of Systems     Objective:   Physical Exam General-in no acute distress Eyes-no discharge Lungs-respiratory rate normal, CTA CV-no murmurs,RRR Extremities skin warm dry no edema Neuro grossly normal Behavior normal, alert        Assessment & Plan:  Assessment and Plan    Knee osteoarthritis Chronic knee osteoarthritis with significant pain. Preparing for knee replacement surgery. Discussed postoperative expectations, pain management, rehabilitation, and potential complications. Emphasized early mobilization and preoperative physical therapy. - Encourage early mobilization post-surgery to reduce thromboembolism risk. - Arrange preoperative physical therapy for postoperative exercises. - Monitor for infection signs: fever, chills, erythema, swelling. - Evaluate for thromboembolism if increased swelling and calf pain occur post-surgery.  Postoperative pain management Anticipated postoperative pain. Discussed analgesia balance with opioid side effects. Oxycodone likely with acetaminophen  adjunct. Emphasized acetaminophen  limit of 4000 mg daily. - Prescribe oxycodone for postoperative analgesia. - Discuss acetaminophen  use with oxycodone, ensuring daily dose does not exceed 4000 mg. - Advise on opioid side effects: nausea, sedation.  Use of antiemetics Anticipated nausea from postoperative opioids. Discussed ondansetron  for nausea management. - Prescribe ondansetron  for nausea management post-surgery.  Use of stool softeners Anticipated constipation from postoperative opioids. Current regimen includes docusate and magnesium . May need polyethylene glycol if constipation persists. - Continue docusate and magnesium  nightly. - Consider adding polyethylene glycol if constipation persists.  Hyperlipidemia Chronic hyperlipidemia with LDL 147 mg/dL, total cholesterol 952 mg/dL. Short-term management deferred until post-surgery recovery. Long-term  management includes potential pravastatin  reinitiation. - Consider reinitiating pravastatin   post-recovery, starting 3 days a week and increasing as tolerated.     All labs were reviewed with patient 1. Other hyperlipidemia (Primary) Cholesterol mildly elevated does not tolerate statins but willing to try pravastatin  after her knee surgeries Perhaps we will initiate just 3 days/week when she does so  2. Essential hypertension, benign Blood pressure good control continue current measures  3. Elevated coronary artery calcium  score In the long run will want to reestablish statins but currently not tolerating them  4. Myalgia due to statin Does not tolerate statins currently with muscle aches and pains in the legs  5. Primary osteoarthritis of both knees Has surgery coming up Her overall activity level exceeds 4 METS her overall health issues are doing well she is considered low risk she will have 1 knee surgery and then 6 weeks later the other  Follow-up by follow-up time with lab work  Patient is a low risk overall for surgery.  Patient has been counseled regarding potential problems that could occur with surgery and she has been encouraged to talk with orthopedics to get the full scope.  Early mobilization important to prevent the possibility of blood clots.  No further testing necessary currently.  Patient does have hypertension and hyperlipidemia but does not have diabetes.

## 2023-11-07 ENCOUNTER — Encounter: Payer: Self-pay | Admitting: Nurse Practitioner

## 2023-11-13 ENCOUNTER — Other Ambulatory Visit: Payer: Self-pay | Admitting: Dermatology

## 2023-11-13 DIAGNOSIS — L700 Acne vulgaris: Secondary | ICD-10-CM

## 2023-11-16 HISTORY — PX: KNEE ARTHROPLASTY: SHX992

## 2023-12-07 ENCOUNTER — Other Ambulatory Visit: Payer: Self-pay | Admitting: Family Medicine

## 2023-12-07 DIAGNOSIS — I10 Essential (primary) hypertension: Secondary | ICD-10-CM

## 2023-12-08 ENCOUNTER — Encounter: Payer: Self-pay | Admitting: Family Medicine

## 2023-12-20 ENCOUNTER — Encounter: Payer: Self-pay | Admitting: Family Medicine

## 2023-12-20 LAB — COMPREHENSIVE METABOLIC PANEL WITH GFR: EGFR (Non-African Amer.): 98

## 2023-12-24 ENCOUNTER — Encounter: Payer: Self-pay | Admitting: Family Medicine

## 2023-12-27 ENCOUNTER — Encounter: Payer: Self-pay | Admitting: Orthopedic Surgery

## 2023-12-28 ENCOUNTER — Emergency Department (HOSPITAL_COMMUNITY): Admission: EM | Admit: 2023-12-28 | Discharge: 2023-12-28 | Disposition: A | Discharge status: Home / Self Care

## 2023-12-28 ENCOUNTER — Emergency Department (HOSPITAL_COMMUNITY)

## 2023-12-28 DIAGNOSIS — S42211A Unspecified displaced fracture of surgical neck of right humerus, initial encounter for closed fracture: Secondary | ICD-10-CM | POA: Insufficient documentation

## 2023-12-28 DIAGNOSIS — S99911A Unspecified injury of right ankle, initial encounter: Secondary | ICD-10-CM | POA: Diagnosis present

## 2023-12-28 DIAGNOSIS — W010XXA Fall on same level from slipping, tripping and stumbling without subsequent striking against object, initial encounter: Secondary | ICD-10-CM | POA: Insufficient documentation

## 2023-12-28 DIAGNOSIS — S82851A Displaced trimalleolar fracture of right lower leg, initial encounter for closed fracture: Secondary | ICD-10-CM | POA: Diagnosis not present

## 2023-12-28 DIAGNOSIS — M25521 Pain in right elbow: Secondary | ICD-10-CM | POA: Diagnosis not present

## 2023-12-28 DIAGNOSIS — Z79899 Other long term (current) drug therapy: Secondary | ICD-10-CM | POA: Diagnosis not present

## 2023-12-28 LAB — CBC WITH DIFFERENTIAL/PLATELET
Abs Immature Granulocytes: 0.14 K/uL — ABNORMAL HIGH (ref 0.00–0.07)
Basophils Absolute: 0.1 K/uL (ref 0.0–0.1)
Basophils Relative: 0 %
Eosinophils Absolute: 0.1 K/uL (ref 0.0–0.5)
Eosinophils Relative: 0 %
HCT: 38.8 % (ref 36.0–46.0)
Hemoglobin: 12.7 g/dL (ref 12.0–15.0)
Immature Granulocytes: 1 %
Lymphocytes Relative: 9 %
Lymphs Abs: 1.6 K/uL (ref 0.7–4.0)
MCH: 30 pg (ref 26.0–34.0)
MCHC: 32.7 g/dL (ref 30.0–36.0)
MCV: 91.5 fL (ref 80.0–100.0)
Monocytes Absolute: 0.9 K/uL (ref 0.1–1.0)
Monocytes Relative: 5 %
Neutro Abs: 16.1 K/uL — ABNORMAL HIGH (ref 1.7–7.7)
Neutrophils Relative %: 85 %
Platelets: 385 K/uL (ref 150–400)
RBC: 4.24 MIL/uL (ref 3.87–5.11)
RDW: 13.1 % (ref 11.5–15.5)
WBC: 18.9 K/uL — ABNORMAL HIGH (ref 4.0–10.5)
nRBC: 0 % (ref 0.0–0.2)

## 2023-12-28 LAB — COMPREHENSIVE METABOLIC PANEL WITH GFR
ALT: 18 U/L (ref 0–44)
AST: 21 U/L (ref 15–41)
Albumin: 4 g/dL (ref 3.5–5.0)
Alkaline Phosphatase: 75 U/L (ref 38–126)
Anion gap: 13 (ref 5–15)
BUN: 9 mg/dL (ref 8–23)
CO2: 22 mmol/L (ref 22–32)
Calcium: 8.9 mg/dL (ref 8.9–10.3)
Chloride: 100 mmol/L (ref 98–111)
Creatinine, Ser: 0.57 mg/dL (ref 0.44–1.00)
GFR, Estimated: >60 mL/min (ref >60)
Glucose, Bld: 134 mg/dL — ABNORMAL HIGH (ref 70–99)
Potassium: 3.2 mmol/L — ABNORMAL LOW (ref 3.5–5.1)
Sodium: 135 mmol/L (ref 135–145)
Total Bilirubin: 0.4 mg/dL (ref 0.0–1.2)
Total Protein: 7.9 g/dL (ref 6.5–8.1)

## 2023-12-28 MED ORDER — OXYCODONE HCL 5 MG PO TABS: 5.0000 mg | ORAL_TABLET | ORAL | 0 refills | Status: DC | PRN

## 2023-12-28 MED ORDER — POTASSIUM CHLORIDE CRYS ER 20 MEQ PO TBCR
40.0000 meq | EXTENDED_RELEASE_TABLET | Freq: Once | ORAL | Status: AC
  Administered 2023-12-28: 40 meq via ORAL
  Filled 2023-12-28: qty 2

## 2023-12-28 MED ORDER — PROPOFOL 10 MG/ML IV BOLUS
0.5000 mg/kg | Freq: Once | INTRAVENOUS | Status: AC
  Administered 2023-12-28: 58 mg via INTRAVENOUS
  Filled 2023-12-28: qty 20

## 2023-12-28 MED ORDER — POTASSIUM CHLORIDE 10 MEQ/100ML IV SOLN: 10.0000 meq | Freq: Once | INTRAVENOUS | Status: DC

## 2023-12-28 MED ORDER — SCOPOLAMINE 1 MG/3DAYS TD PT72
1.0000 | MEDICATED_PATCH | TRANSDERMAL | Status: DC
  Administered 2023-12-28: 1.5 mg via TRANSDERMAL
  Filled 2023-12-28: qty 1

## 2023-12-28 MED ORDER — OXYCODONE HCL 5 MG PO TABS
5.0000 mg | ORAL_TABLET | Freq: Once | ORAL | Status: AC
  Administered 2023-12-28: 5 mg via ORAL
  Filled 2023-12-28: qty 1

## 2023-12-28 MED ORDER — ONDANSETRON HCL 4 MG/2ML IJ SOLN
4.0000 mg | Freq: Once | INTRAMUSCULAR | Status: AC
  Administered 2023-12-28: 4 mg via INTRAVENOUS
  Filled 2023-12-28: qty 2

## 2023-12-28 MED ORDER — FENTANYL CITRATE PF 50 MCG/ML IJ SOSY
75.0000 ug | PREFILLED_SYRINGE | Freq: Once | INTRAMUSCULAR | Status: AC
  Administered 2023-12-28: 75 ug via INTRAVENOUS
  Filled 2023-12-28: qty 2

## 2023-12-28 MED ORDER — MORPHINE SULFATE (PF) 2 MG/ML IV SOLN: 2.0000 mg | Freq: Once | INTRAVENOUS | Status: DC

## 2023-12-28 NOTE — Progress Notes (Signed)
 Orthopedic Tech Progress Note Patient Details:  Anita Shea 07-01-1960 [id number]  Ortho Devices Type of Ortho Device: Ace wrap, Cotton web roll, Post (short leg) splint, Stirrup splint, Shoulder immobilizer Ortho Device/Splint Location: right short leg posterior and stirrup splint applied. sling applied to right shoulder Ortho Device/Splint Interventions: Ordered, Application, Adjustment   Post Interventions Patient Tolerated: Well Instructions Provided: Adjustment of device, Care of device  Waylan Thom Loving 12/28/2023, 3:05 PM

## 2023-12-28 NOTE — Sedation Documentation (Signed)
 Provider starting reduction now

## 2023-12-28 NOTE — ED Provider Notes (Signed)
 [facility] AT [facility] Provider Note   CSN: [id number] Arrival date & time: 12/28/23  1006     Patient presents with: [place]   Anita Shea is a 63 y.o. female.    Fall Pertinent negatives include no chest pain, no abdominal pain and no shortness of breath.   Presents with fall.  Patient states that she was tripped by her dogs today.  Landed on her right side.  Endorsing right shoulder pain, right humerus pain, right elbow pain.  Also endorsing right ankle pain.  Denies any significant pain to right knee.  Did not hit her head did not lose consciousness.  Takes no anticoagulation.  No cervical thoracic or lumbar pain.  Patient denies any, chest pain or shortness of breath.  No numbness or tingling anywhere. Recent right knee replacement.     Prior to Admission medications   Medication Sig Start Date End Date Taking? Authorizing Provider  oxyCODONE (ROXICODONE) 5 MG immediate release tablet Take 1 tablet (5 mg total) by mouth every 4 (four) hours as needed for severe pain (pain score 7-10). 12/28/23  Yes Romaldo Saville N, MD  cetirizine (ZYRTEC) 10 MG tablet Take 10 mg by mouth daily.    [provider]  dicyclomine  (BENTYL ) 20 MG tablet TAKE 1 TABLET BY MOUTH THREE TIMES DAILY AS NEEDED FOR  ABDOMINAL  CRAMPING 09/18/23   Luking, Glendia LABOR, MD  docusate sodium  (COLACE) 100 MG capsule Take 2 capsules (200 mg total) by mouth daily. 03/08/17   Rehman, Claudis PENNER, MD  fluticasone (FLONASE) 50 MCG/ACT nasal spray Place 1 spray into both nostrils daily.    [provider]  hydrochlorothiazide  (HYDRODIURIL ) 25 MG tablet Take 1 tablet by mouth once daily 12/08/23   Luking, Glendia LABOR, MD  Magnesium  100 MG TABS Take by mouth.    [provider]  Multiple Vitamin (MULTIVITAMIN) tablet Take 1 tablet by mouth daily.    [provider]  Omeprazole  Magnesium  (PRILOSEC OTC PO) Take by mouth daily at 6 (six) AM.    [provider]   ondansetron  (ZOFRAN -ODT) 8 MG disintegrating tablet Take 1 tablet (8 mg total) by mouth every 8 (eight) hours as needed for nausea or vomiting. 10/25/23   Alphonsa Glendia LABOR, MD  OVER THE COUNTER MEDICATION B 12 once 1000 mcg daily  D3 2000 iu  daily.    [provider]  potassium chloride  SA (KLOR-CON  M) 20 MEQ tablet Take 1 tablet (20 mEq total) by mouth 2 (two) times daily. 07/04/23   Alphonsa Glendia LABOR, MD  pravastatin  (PRAVACHOL ) 20 MG tablet Take 1 tablet (20 mg total) by mouth daily. Patient not taking: Reported on 10/25/2023 05/18/23   Alphonsa Glendia LABOR, MD  simethicone  (MYLICON) 125 MG chewable tablet Chew 250 mg by mouth at bedtime.    [provider]  tretinoin  (RETIN-A ) 0.025 % cream USE A PEA-SIZED AMOUNT TWICE A WEEK AT NIGHT FOR THE FIRST MONTH, THEN INCREASE TO THREE TIMES A WEEK AT NIGHT 11/13/23   Alm Delon SAILOR, DO    Allergies: Aspirin, Lisinopril, Nsaids, and Crestor  [rosuvastatin ]    Review of Systems  Constitutional:  Negative for chills and fever.  HENT:  Negative for ear pain and sore throat.   Eyes:  Negative for pain and visual disturbance.  Respiratory:  Negative for cough and shortness of breath.   Cardiovascular:  Negative for chest pain and palpitations.  Gastrointestinal:  Negative for abdominal pain and vomiting.  Genitourinary:  Negative for dysuria and hematuria.  Musculoskeletal:  Negative for arthralgias and back pain.  Skin:  Negative for color change and rash.  Neurological:  Negative for seizures and syncope.  All other systems reviewed and are negative.   Updated Vital Signs BP (!) 153/83   Pulse 98   Temp 98.1 F (36.7 C) (Oral)   Resp 18   Ht 5' 7 (1.702 m)   Wt 77.1 kg   LMP 09/18/2012   SpO2 92%   BMI 26.63 kg/m   Physical Exam Vitals and nursing note reviewed.  Constitutional:      General: She is not in acute distress.    Appearance: She is well-developed.  HENT:     Head: Normocephalic and atraumatic.  Eyes:      Conjunctiva/sclera: Conjunctivae normal.  Cardiovascular:     Rate and Rhythm: Normal rate and regular rhythm.     Heart sounds: No murmur heard. Pulmonary:     Effort: Pulmonary effort is normal. No respiratory distress.     Breath sounds: Normal breath sounds.  Abdominal:     Palpations: Abdomen is soft.     Tenderness: There is no abdominal tenderness.  Musculoskeletal:        General: No swelling.       Arms:     Cervical back: Neck supple.       Legs:  Skin:    General: Skin is warm and dry.     Capillary Refill: Capillary refill takes less than 2 seconds.  Neurological:     Mental Status: She is alert.  Psychiatric:        Mood and Affect: Mood normal.     (all labs ordered are listed, but only abnormal results are displayed) Labs Reviewed  CBC WITH DIFFERENTIAL/PLATELET - Abnormal; Notable for the following components:      Result Value   WBC 18.9 (*)    Neutro Abs 16.1 (*)    Abs Immature Granulocytes 0.14 (*)    All other components within normal limits  COMPREHENSIVE METABOLIC PANEL WITH GFR - Abnormal; Notable for the following components:   Potassium 3.2 (*)    Glucose, Bld 134 (*)    All other components within normal limits    EKG: None  Radiology: DG Tibia/Fibula Right Result Date: 12/28/2023 CLINICAL DATA:  fall. Trauma; s/p reduction EXAM: RIGHT TIBIA AND FIBULA - 2 VIEW; RIGHT ANKLE - COMPLETE 3+ VIEW COMPARISON:  X-ray right knee 04/28/2023, x-ray tibia fibula right 12/28/2023 FINDINGS: Improved anatomical alignment of the ankle joint with interval reduction of prior tibiotalar dislocation. Redemonstration of acute trimalleolar displaced and comminuted fracture of the ankle with improved alignment. No other definite acute fracture or dislocation of the bones of the tibia and fibula proximally. Total knee arthroplasty with an 11 mm density within the tibiofemoral joint space of unclear etiology-finding could represent a knee fracture fragment. Small  knee joint effusion. No aggressive appearing focal bone lesions. Soft tissues are unremarkable. Splint overlies bones. IMPRESSION: 1. Improved anatomical alignment of the ankle joint with interval reduction of prior tibiotalar dislocation. 2. Redemonstration of acute trimalleolar displaced and comminuted fracture of the ankle with improved alignment. 3. Total knee arthroplasty with an 11 mm density within the tibiofemoral joint space of unclear etiology-finding could represent a knee fracture fragment. 4. Small knee joint effusion. Electronically Signed   By: Morgane  Naveau M.D.   On: 12/28/2023 15:52   DG Ankle Complete Right Result Date: 12/28/2023 CLINICAL DATA:  fall. Trauma; s/p reduction EXAM: RIGHT TIBIA AND FIBULA - 2 VIEW; RIGHT ANKLE - COMPLETE 3+ VIEW COMPARISON:  X-ray right knee 04/28/2023, x-ray tibia fibula right 12/28/2023 FINDINGS: Improved anatomical alignment of the ankle joint with interval reduction of prior tibiotalar dislocation. Redemonstration of acute trimalleolar displaced and comminuted fracture of the ankle with improved alignment. No other definite acute fracture or dislocation of the bones of the tibia and fibula proximally. Total knee arthroplasty with an 11 mm density within the tibiofemoral joint space of unclear etiology-finding could represent a knee fracture fragment. Small knee joint effusion. No aggressive appearing focal bone lesions. Soft tissues are unremarkable. Splint overlies bones. IMPRESSION: 1. Improved anatomical alignment of the ankle joint with interval reduction of prior tibiotalar dislocation. 2. Redemonstration of acute trimalleolar displaced and comminuted fracture of the ankle with improved alignment. 3. Total knee arthroplasty with an 11 mm density within the tibiofemoral joint space of unclear etiology-finding could represent a knee fracture fragment. 4. Small knee joint effusion. Electronically Signed   By: Morgane  Naveau M.D.   On: 12/28/2023 15:52    DG Wrist Complete Right Result Date: 12/28/2023 CLINICAL DATA:  Pain after fall. EXAM: RIGHT WRIST - COMPLETE 3+ VIEW COMPARISON:  None Available. FINDINGS: There is no evidence of acute fracture or dislocation. The carpal rows appear intact. Moderate first CMC osteoarthritis. No significant soft tissue abnormalities are seen. IMPRESSION: 1. No acute osseous abnormality. 2. Moderate first CMC osteoarthritis. Electronically Signed   By: Harrietta Sherry M.D.   On: 12/28/2023 13:35   DG Tibia/Fibula Right Result Date: 12/28/2023 CLINICAL DATA:  Pain after fall. EXAM: RIGHT FOOT - 2 VIEW; RIGHT TIBIA AND FIBULA - 2 VIEW COMPARISON:  Right knee radiographs dated 04/28/2023. FINDINGS: Acute trimalleolar ankle fracture with lateral tibiotalar dislocation and valgus angulation. Transverse fracture of the medial malleolus with approximately 11 mm of lateral displacement. Oblique fracture of the distal fibular metaphysis with approximately 7-8 mm of lateral and mild posterior displacement of the lateral malleolar fracture component. The lateral and medial malleolar fracture fragments appear to articulate with the laterally displaced talus. Posterior malleolar fracture with approximately 11 mm of superior displacement of the fracture fragment. Intact right total knee arthroplasty with appropriate alignment. IMPRESSION: Acute trimalleolar ankle fracture with lateral tibiotalar dislocation and valgus angulation. Electronically Signed   By: Harrietta Sherry M.D.   On: 12/28/2023 13:33   DG Foot 2 Views Right Result Date: 12/28/2023 CLINICAL DATA:  Pain after fall. EXAM: RIGHT FOOT - 2 VIEW; RIGHT TIBIA AND FIBULA - 2 VIEW COMPARISON:  Right knee radiographs dated 04/28/2023. FINDINGS: Acute trimalleolar ankle fracture with lateral tibiotalar dislocation and valgus angulation. Transverse fracture of the medial malleolus with approximately 11 mm of lateral displacement. Oblique fracture of the distal fibular metaphysis  with approximately 7-8 mm of lateral and mild posterior displacement of the lateral malleolar fracture component. The lateral and medial malleolar fracture fragments appear to articulate with the laterally displaced talus. Posterior malleolar fracture with approximately 11 mm of superior displacement of the fracture fragment. Intact right total knee arthroplasty with appropriate alignment. IMPRESSION: Acute trimalleolar ankle fracture with lateral tibiotalar dislocation and valgus angulation. Electronically Signed   By: Harrietta Sherry M.D.   On: 12/28/2023 13:33   DG Humerus Right Result Date: 12/28/2023 CLINICAL DATA:  Injury after fall. EXAM: RIGHT HUMERUS - 2+ VIEW COMPARISON:  None Available. FINDINGS: Acute comminuted fracture of the right proximal humerus with transverse fracture at/just below the level of the surgical  neck with approximately 25 degrees of apex anterior angulation. Fracture margins extend through the greater tuberosity with mild lateral displacement. The glenohumeral joint is anatomically aligned. The acromioclavicular joint is anatomically aligned. IMPRESSION: Acute comminuted, angulated, and displaced fracture of the right proximal humerus involving the greater tuberosity and surgical neck. Electronically Signed   By: Harrietta Sherry M.D.   On: 12/28/2023 13:23     .Reduction of fracture  Date/Time: 12/28/2023 3:00 PM  Performed by: Simon Lavonia SAILOR, MD Authorized by: Simon Lavonia SAILOR, MD  Consent: Verbal consent obtained. Written consent obtained Consent given by: patient Patient understanding: patient states understanding of the procedure being performed Patient consent: the patient's understanding of the procedure matches consent given Procedure consent: procedure consent matches procedure scheduled Relevant documents: relevant documents present and verified Test results: test results available and properly labeled Site marked: the operative site was marked Imaging  studies: imaging studies available Patient identity confirmed: arm band and verbally with patient Time out: Immediately prior to procedure a time out was called to verify the correct patient, procedure, equipment, support staff and site/side marked as required.  Sedation: Patient sedated: yes Sedation type: moderate (conscious) sedation Sedatives: propofol  Sedation start date/time: 12/28/2023 2:41 PM Sedation end date/time: 12/28/2023 3:01 PM  Patient tolerance: patient tolerated the procedure well with no immediate complications   .Reduction of dislocation  Date/Time: 12/28/2023 3:01 PM  Performed by: Simon Lavonia SAILOR, MD Authorized by: Simon Lavonia SAILOR, MD  Consent: Verbal consent obtained. Written consent obtained Consent given by: patient Patient understanding: patient states understanding of the procedure being performed Patient consent: the patient's understanding of the procedure matches consent given Procedure consent: procedure consent matches procedure scheduled Relevant documents: relevant documents present and verified Test results: test results available and properly labeled Site marked: the operative site was marked Imaging studies: imaging studies available Patient identity confirmed: verbally with patient and arm band Time out: Immediately prior to procedure a time out was called to verify the correct patient, procedure, equipment, support staff and site/side marked as required. Patient tolerance: patient tolerated the procedure well with no immediate complications   .Splint Application  Date/Time: 12/28/2023 3:02 PM  Performed by: Simon Lavonia SAILOR, MD Authorized by: Simon Lavonia SAILOR, MD   Consent:    Consent obtained:  Verbal and written   Consent given by:  Patient   Risks discussed:  Discoloration, numbness, pain and swelling   Alternatives discussed:  Alternative treatment Universal protocol:    Patient identity confirmed:  Hospital-assigned identification number  and verbally with patient Pre-procedure details:    Distal neurologic exam:  Normal   Distal perfusion: distal pulses strong   Procedure details:    Location:  Ankle   Ankle location:  R ankle   Cast type:  Short leg   Splint type:  Ankle stirrup   Supplies:  Fiberglass Post-procedure details:    Distal neurologic exam:  Normal   Distal perfusion: distal pulses strong     Procedure completion:  Tolerated   Post-procedure imaging: reviewed   .Sedation  Date/Time: 12/28/2023 2:41 PM  Performed by: Simon Lavonia SAILOR, MD Authorized by: Simon Lavonia SAILOR, MD   Consent:    Consent obtained:  Verbal and written   Consent given by:  Patient   Risks discussed:  Allergic reaction, prolonged hypoxia resulting in organ damage, prolonged sedation necessitating reversal, respiratory compromise necessitating ventilatory assistance and intubation, vomiting, nausea, inadequate sedation and dysrhythmia   Alternatives discussed:  Analgesia without sedation and  anxiolysis Universal protocol:    Immediately prior to procedure, a time out was called: yes   Indications:    Procedure performed:  Dislocation reduction   Procedure necessitating sedation performed by:  Physician performing sedation Pre-sedation assessment:    Time since last food or drink:  6 hours   ASA classification: class 2 - patient with mild systemic disease     Mouth opening:  2 finger widths   Thyromental distance:  3 finger widths   Mallampati score:  II - soft palate, uvula, fauces visible   Neck mobility: normal     Pre-sedation assessments completed and reviewed: airway patency, cardiovascular function, hydration status, mental status, nausea/vomiting, pain level, respiratory function and temperature   A pre-sedation assessment was completed prior to the start of the procedure Immediate pre-procedure details:    Reviewed: vital signs, relevant labs/tests and NPO status     Verified: bag valve mask available, emergency equipment  available, intubation equipment available, IV patency confirmed, oxygen available and suction available   Procedure details (see MAR for exact dosages):    Preoxygenation:  Nasal cannula   Sedation:  Propofol    Intended level of sedation: moderate (conscious sedation)   Intra-procedure monitoring:  Blood pressure monitoring, continuous capnometry, frequent LOC assessments, frequent vital sign checks, continuous pulse oximetry and cardiac monitor   Intra-procedure events: none     Total Provider sedation time (minutes):  20 Post-procedure details:   A post-sedation assessment was completed following the completion of the procedure.   Attendance: Constant attendance by certified staff until patient recovered     Recovery: Patient returned to pre-procedure baseline     Post-sedation assessments completed and reviewed: airway patency, cardiovascular function, hydration status, mental status, nausea/vomiting, pain level, respiratory function and temperature     Patient is stable for discharge or admission: yes     Procedure completion:  Tolerated well, no immediate complications    Medications Ordered in the ED  scopolamine (TRANSDERM-SCOP) 1 MG/3DAYS 1.5 mg (1.5 mg Transdermal Patch Applied 12/28/23 1428)  potassium chloride  SA (KLOR-CON  M) CR tablet 40 mEq (has no administration in time range)  oxyCODONE (Oxy IR/ROXICODONE) immediate release tablet 5 mg (has no administration in time range)  fentaNYL (SUBLIMAZE) injection 75 mcg (75 mcg Intravenous Given 12/28/23 1134)  fentaNYL (SUBLIMAZE) injection 75 mcg (75 mcg Intravenous Given 12/28/23 1308)  ondansetron  (ZOFRAN ) injection 4 mg (4 mg Intravenous Given 12/28/23 1308)  propofol  (DIPRIVAN ) 10 mg/mL bolus/IV push 38.6 mg (58 mg Intravenous Given 12/28/23 1447)  ondansetron  (ZOFRAN ) injection 4 mg (4 mg Intravenous Given 12/28/23 1428)  fentaNYL (SUBLIMAZE) injection 75 mcg (75 mcg Intravenous Given 12/28/23 1600)                                     Medical Decision Making Amount and/or Complexity of Data Reviewed Labs: ordered. Radiology: ordered.  Risk Prescription drug management.    Presents with fall.  Patient states that she was tripped by her dogs today.  Landed on her right side.  Endorsing right shoulder pain, right humerus pain, right elbow pain.  Also endorsing right ankle pain.  Denies any significant pain to right knee.  Did not hit her head did not lose consciousness.  Takes no anticoagulation.  No cervical thoracic or lumbar pain.  Patient denies any, chest pain or shortness of breath.  No numbness or tingling anywhere. Recent right knee replacement.  On exam, patient ANO x 3 GCS 15.  No focal deficits.    C-spine clear.  No pain to palpation midline.  No pain with any kind of movement of her head both left or right or extension or flexion.  No thoracic or lumbar pain to palpation.  No head strike.  No bruising to the head.  No anticoagulation.  No indication to obtain CT imaging of the head.    Pain to palpation of the right humerus area.  Patient sensation and strength intact distal to this injury site.  Pulses intact of the radial.  X-ray of the humerus.  Show fracture of the right proximal humerus.  Subsequent placed patient in sling.   Pain and swelling on the right ankle.  Intact dorsal pedal and posterior tibial pulses.  Warm and well-perfused foot.  They confirm trimalleolar fracture with some dislocation.  Given this, I did consent the patient to sedation and reduction.  Was able to reduce the ankle with success.  Fall was used for sedation.   Orthopedic surgery.  They will follow-up with them outpatient.  Recommended CT scanning of the patient's right ankle for surgical planning. CT obtained.    Patient will take oxycodone as well as Tylenol  for any kind of pain.  Strict return precautions for any kind of numbness and tingling aware.  There is no skin tenting and patient's pulses remained intact posterior  tibial as well as dorsal pedal pulses as well as right radial pulse.  No concerns again of ischemic pathology.  No skin tenting.       Final diagnoses:  Closed trimalleolar fracture of right ankle, initial encounter  Closed displaced fracture of surgical neck of right humerus, unspecified fracture morphology, initial encounter    ED Discharge Orders          Ordered    oxyCODONE (ROXICODONE) 5 MG immediate release tablet  Every 4 hours PRN        12/28/23 1527               Simon Lavonia SAILOR, MD 12/28/23 1650

## 2023-12-28 NOTE — Progress Notes (Signed)
 Respiratory Therapist at [facility]  in room number [phone] during procedure.  Suction with [place] at [facility] set up and ready to use. Ambu bag at [facility] and ready to use.  Patient placed on ETCO2 Nasal Cannula at 2 LPM.  Vitals at conclusion of procedure:  ETCO2 35 mmHg HR 104 RR 20 SPO2 100  Patient awake and able to verbalize name.

## 2023-12-28 NOTE — ED Triage Notes (Addendum)
 BIB [facility] for R ankle (noted deformity) and R arm injury (no deformity noted). Tripped over her dogs and landed on the ankle and arm. No thinners. No LOC, no headache. Pain rated 8 in both ankle and arm   Given 5mg  oxy at 9am fentanyl @ 9:37am and 2nd dose at 10am   130/94 95 O2 RA 100 pulse

## 2023-12-28 NOTE — Sedation Documentation (Signed)
 Second dose of proprofol given

## 2023-12-28 NOTE — Discharge Instructions (Addendum)
 I have called in medication for your pain.   Do not take more than 4 g or 4000 mg of tylenol  in a 24 hr period. You can take up to 1000 mg every 6-8 hours.   If you notice any numbness or tingling please come back to the ED for further care.   Do not place weight on your foot.

## 2023-12-28 NOTE — ED Notes (Signed)
 Ice pack provided for rt ankle and post neck, bed repositioned, towel placed behind rt shoulder for comfort. Pt requested pain and nausea meds, EDP aware. Pt aware she should remain NPO until further notice. Call light in reach.

## 2023-12-31 ENCOUNTER — Telehealth: Payer: Self-pay | Admitting: Family Medicine

## 2023-12-31 ENCOUNTER — Encounter: Payer: Self-pay | Admitting: Family Medicine

## 2023-12-31 NOTE — Telephone Encounter (Signed)
 Nurses-please order metabolic 7, magnesium , CBC  Diagnosis leukocytosis, hypokalemia  Order through [facility] patient aware

## 2024-01-01 ENCOUNTER — Other Ambulatory Visit: Payer: Self-pay

## 2024-01-01 DIAGNOSIS — E876 Hypokalemia: Secondary | ICD-10-CM

## 2024-01-01 DIAGNOSIS — D72829 Elevated white blood cell count, unspecified: Secondary | ICD-10-CM

## 2024-01-02 ENCOUNTER — Other Ambulatory Visit: Payer: Self-pay

## 2024-01-02 ENCOUNTER — Encounter (HOSPITAL_COMMUNITY): Payer: Self-pay | Admitting: Orthopedic Surgery

## 2024-01-02 NOTE — Progress Notes (Signed)
 SDW CALL  Patient was given pre-op instructions over the phone. The opportunity was given for the patient to ask questions. No further questions asked. Patient verbalized understanding of instructions given.   PCP - Dr. Glendia Fielding Cardiologist - denies  PPM/ICD - denies   Chest x-ray -  EKG - 06/27/23 Stress Test -  ECHO -  Cardiac Cath -   Sleep Study - denies  NO DM  Last dose of GLP1 agonist-  n/a GLP1 instructions: n/a  Blood Thinner Instructions: n/a Aspirin Instructions: allergic to Aspirin and NSAIDS  ERAS Protcol - clears until 1415 PRE-SURGERY Ensure or G2- n/a  COVID TEST- n/a   Anesthesia review: no  Patient denies shortness of breath, fever, cough and chest pain over the phone call   All instructions explained to the patient, with a verbal understanding of the material. Patient agrees to go over the instructions while at home for a better understanding.

## 2024-01-03 ENCOUNTER — Ambulatory Visit (HOSPITAL_BASED_OUTPATIENT_CLINIC_OR_DEPARTMENT_OTHER): Payer: Self-pay | Admitting: Anesthesiology

## 2024-01-03 ENCOUNTER — Other Ambulatory Visit: Payer: Self-pay

## 2024-01-03 ENCOUNTER — Ambulatory Visit (HOSPITAL_COMMUNITY): Payer: Self-pay | Admitting: Anesthesiology

## 2024-01-03 ENCOUNTER — Encounter (HOSPITAL_COMMUNITY): Payer: Self-pay | Admitting: Orthopedic Surgery

## 2024-01-03 ENCOUNTER — Observation Stay (HOSPITAL_COMMUNITY)
Admission: RE | Admit: 2024-01-03 | Discharge: 2024-01-05 | Disposition: A | Discharge status: Home Health | Attending: Orthopedic Surgery | Admitting: Orthopedic Surgery

## 2024-01-03 ENCOUNTER — Encounter: Payer: Self-pay | Admitting: Family Medicine

## 2024-01-03 ENCOUNTER — Ambulatory Visit (HOSPITAL_COMMUNITY)

## 2024-01-03 ENCOUNTER — Encounter (HOSPITAL_COMMUNITY)
Admission: RE | Disposition: A | Discharge status: Home Health | Payer: Self-pay | Source: Home / Self Care | Attending: Orthopedic Surgery

## 2024-01-03 DIAGNOSIS — Z79899 Other long term (current) drug therapy: Secondary | ICD-10-CM | POA: Insufficient documentation

## 2024-01-03 DIAGNOSIS — S42201A Unspecified fracture of upper end of right humerus, initial encounter for closed fracture: Secondary | ICD-10-CM | POA: Diagnosis not present

## 2024-01-03 DIAGNOSIS — Z96651 Presence of right artificial knee joint: Secondary | ICD-10-CM | POA: Diagnosis not present

## 2024-01-03 DIAGNOSIS — I10 Essential (primary) hypertension: Secondary | ICD-10-CM | POA: Diagnosis not present

## 2024-01-03 DIAGNOSIS — X58XXXA Exposure to other specified factors, initial encounter: Secondary | ICD-10-CM | POA: Diagnosis not present

## 2024-01-03 DIAGNOSIS — S42221A 2-part displaced fracture of surgical neck of right humerus, initial encounter for closed fracture: Secondary | ICD-10-CM | POA: Diagnosis present

## 2024-01-03 DIAGNOSIS — S42291A Other displaced fracture of upper end of right humerus, initial encounter for closed fracture: Principal | ICD-10-CM | POA: Diagnosis present

## 2024-01-03 HISTORY — PX: HUMERUS IM NAIL: SHX1769

## 2024-01-03 LAB — CBC
HCT: 34.3 % — ABNORMAL LOW (ref 36.0–46.0)
Hemoglobin: 11.3 g/dL — ABNORMAL LOW (ref 12.0–15.0)
MCH: 29.9 pg (ref 26.0–34.0)
MCHC: 32.9 g/dL (ref 30.0–36.0)
MCV: 90.7 fL (ref 80.0–100.0)
Platelets: 402 K/uL — ABNORMAL HIGH (ref 150–400)
RBC: 3.78 MIL/uL — ABNORMAL LOW (ref 3.87–5.11)
RDW: 12.6 % (ref 11.5–15.5)
WBC: 10.4 K/uL (ref 4.0–10.5)
nRBC: 0 % (ref 0.0–0.2)

## 2024-01-03 LAB — CREATININE, SERUM
Creatinine, Ser: 0.67 mg/dL (ref 0.44–1.00)
GFR, Estimated: >60 mL/min (ref >60)

## 2024-01-03 SURGERY — INSERTION, INTRAMEDULLARY ROD, HUMERUS
Anesthesia: General | Site: Shoulder | Laterality: Right

## 2024-01-03 MED ORDER — HYDROCHLOROTHIAZIDE 25 MG PO TABS
25.0000 mg | ORAL_TABLET | Freq: Every day | ORAL | Status: DC
  Administered 2024-01-03 – 2024-01-05 (×3): 25 mg via ORAL
  Filled 2024-01-03 (×3): qty 1

## 2024-01-03 MED ORDER — ACETAMINOPHEN 500 MG PO TABS
ORAL_TABLET | ORAL | Status: AC
  Administered 2024-01-03: 1000 mg via ORAL
  Filled 2024-01-03: qty 2

## 2024-01-03 MED ORDER — OMEPRAZOLE MAGNESIUM 20 MG PO TBEC: 20.0000 mg | DELAYED_RELEASE_TABLET | Freq: Every day | ORAL | Status: DC

## 2024-01-03 MED ORDER — FAMOTIDINE 20 MG PO TABS
10.0000 mg | ORAL_TABLET | Freq: Every day | ORAL | Status: DC
  Administered 2024-01-04 – 2024-01-05 (×2): 20 mg via ORAL
  Filled 2024-01-03 (×2): qty 1

## 2024-01-03 MED ORDER — BUPIVACAINE HCL (PF) 0.5 % IJ SOLN
INTRAMUSCULAR | Status: DC | PRN
  Administered 2024-01-03: 15 mL via PERINEURAL

## 2024-01-03 MED ORDER — LIDOCAINE 2% (20 MG/ML) 5 ML SYRINGE
INTRAMUSCULAR | Status: DC | PRN
  Administered 2024-01-03: 60 mg via INTRAVENOUS

## 2024-01-03 MED ORDER — MIDAZOLAM HCL 2 MG/2ML IJ SOLN
INTRAMUSCULAR | Status: DC | PRN
  Administered 2024-01-03: 2 mg via INTRAVENOUS

## 2024-01-03 MED ORDER — OXYCODONE HCL 5 MG/5ML PO SOLN: 5.0000 mg | Freq: Once | ORAL | Status: DC | PRN

## 2024-01-03 MED ORDER — MIDAZOLAM HCL 2 MG/2ML IJ SOLN
INTRAMUSCULAR | Status: AC
  Filled 2024-01-03: qty 2

## 2024-01-03 MED ORDER — MAGNESIUM CHLORIDE 64 MG PO TBEC
1.0000 | DELAYED_RELEASE_TABLET | Freq: Every day | ORAL | Status: DC
  Administered 2024-01-03 – 2024-01-04 (×2): 64 mg via ORAL
  Filled 2024-01-03 (×3): qty 1

## 2024-01-03 MED ORDER — DEXMEDETOMIDINE HCL IN NACL 80 MCG/20ML IV SOLN
INTRAVENOUS | Status: DC | PRN
Start: 2024-01-03 — End: 2024-01-03
  Administered 2024-01-03: 4 ug via INTRAVENOUS

## 2024-01-03 MED ORDER — FENTANYL CITRATE (PF) 250 MCG/5ML IJ SOLN
INTRAMUSCULAR | Status: DC | PRN
  Administered 2024-01-03 (×2): 50 ug via INTRAVENOUS

## 2024-01-03 MED ORDER — LACTATED RINGERS IV SOLN: INTRAVENOUS | Status: DC

## 2024-01-03 MED ORDER — METOCLOPRAMIDE HCL 5 MG PO TABS: 5.0000 mg | ORAL_TABLET | Freq: Three times a day (TID) | ORAL | Status: DC | PRN

## 2024-01-03 MED ORDER — 0.9 % SODIUM CHLORIDE (POUR BTL) OPTIME
TOPICAL | Status: DC | PRN
Start: 2024-01-03 — End: 2024-01-03
  Administered 2024-01-03: 1000 mL

## 2024-01-03 MED ORDER — OXYCODONE HCL 5 MG PO TABS: 5.0000 mg | ORAL_TABLET | ORAL | 0 refills | Status: DC | PRN

## 2024-01-03 MED ORDER — CEFAZOLIN SODIUM-DEXTROSE 2-4 GM/100ML-% IV SOLN
2.0000 g | INTRAVENOUS | Status: AC
Start: 2024-01-03 — End: 2024-01-03
  Administered 2024-01-03: 2 g via INTRAVENOUS

## 2024-01-03 MED ORDER — SCOPOLAMINE 1 MG/3DAYS TD PT72
MEDICATED_PATCH | TRANSDERMAL | Status: AC
  Administered 2024-01-03: 1.5 mg via TRANSDERMAL
  Filled 2024-01-03: qty 1

## 2024-01-03 MED ORDER — ACETAMINOPHEN 500 MG PO TABS: 1000.0000 mg | ORAL_TABLET | Freq: Once | ORAL | Status: AC

## 2024-01-03 MED ORDER — ACETAMINOPHEN 500 MG PO TABS
500.0000 mg | ORAL_TABLET | Freq: Four times a day (QID) | ORAL | Status: AC
  Administered 2024-01-03 – 2024-01-04 (×2): 500 mg via ORAL
  Filled 2024-01-03 (×3): qty 1

## 2024-01-03 MED ORDER — PHENOL 1.4 % MT LIQD: 1.0000 | OROMUCOSAL | Status: DC | PRN

## 2024-01-03 MED ORDER — PROPOFOL 10 MG/ML IV BOLUS
INTRAVENOUS | Status: AC
  Filled 2024-01-03: qty 20

## 2024-01-03 MED ORDER — ONDANSETRON 8 MG PO TBDP: 8.0000 mg | ORAL_TABLET | Freq: Three times a day (TID) | ORAL | 2 refills | Status: DC | PRN

## 2024-01-03 MED ORDER — ONDANSETRON 4 MG PO TBDP: 4.0000 mg | ORAL_TABLET | Freq: Three times a day (TID) | ORAL | 0 refills | Status: DC | PRN

## 2024-01-03 MED ORDER — ONDANSETRON HCL 4 MG/2ML IJ SOLN: 4.0000 mg | Freq: Four times a day (QID) | INTRAMUSCULAR | Status: DC | PRN

## 2024-01-03 MED ORDER — ROCURONIUM BROMIDE 10 MG/ML (PF) SYRINGE
PREFILLED_SYRINGE | INTRAVENOUS | Status: AC
  Filled 2024-01-03: qty 10

## 2024-01-03 MED ORDER — SUGAMMADEX SODIUM 200 MG/2ML IV SOLN
INTRAVENOUS | Status: DC | PRN
  Administered 2024-01-03: 100 mg via INTRAVENOUS

## 2024-01-03 MED ORDER — CHLORHEXIDINE GLUCONATE 0.12 % MT SOLN
OROMUCOSAL | Status: AC
  Administered 2024-01-03: 15 mL via OROMUCOSAL
  Filled 2024-01-03: qty 15

## 2024-01-03 MED ORDER — ONDANSETRON HCL 4 MG PO TABS
4.0000 mg | ORAL_TABLET | Freq: Four times a day (QID) | ORAL | Status: DC | PRN
  Administered 2024-01-03 – 2024-01-04 (×3): 4 mg via ORAL
  Filled 2024-01-03 (×3): qty 1

## 2024-01-03 MED ORDER — BUPIVACAINE LIPOSOME 1.3 % IJ SUSP
INTRAMUSCULAR | Status: DC | PRN
  Administered 2024-01-03: 10 mL via PERINEURAL

## 2024-01-03 MED ORDER — ONDANSETRON HCL 4 MG/2ML IJ SOLN
INTRAMUSCULAR | Status: DC | PRN
  Administered 2024-01-03: 4 mg via INTRAVENOUS

## 2024-01-03 MED ORDER — PANTOPRAZOLE SODIUM 40 MG PO TBEC
40.0000 mg | DELAYED_RELEASE_TABLET | Freq: Every day | ORAL | Status: DC
  Administered 2024-01-03 – 2024-01-05 (×3): 40 mg via ORAL
  Filled 2024-01-03 (×3): qty 1

## 2024-01-03 MED ORDER — FENTANYL CITRATE (PF) 100 MCG/2ML IJ SOLN: 25.0000 ug | INTRAMUSCULAR | Status: DC | PRN

## 2024-01-03 MED ORDER — HYDROCODONE-ACETAMINOPHEN 7.5-325 MG PO TABS
1.0000 | ORAL_TABLET | ORAL | Status: DC | PRN
  Administered 2024-01-04 (×4): 2 via ORAL
  Administered 2024-01-05: 1 via ORAL
  Administered 2024-01-05 (×2): 2 via ORAL
  Filled 2024-01-03: qty 2
  Filled 2024-01-03: qty 1
  Filled 2024-01-03 (×6): qty 2

## 2024-01-03 MED ORDER — FENTANYL CITRATE (PF) 250 MCG/5ML IJ SOLN
INTRAMUSCULAR | Status: AC
  Filled 2024-01-03: qty 5

## 2024-01-03 MED ORDER — HYDROCODONE-ACETAMINOPHEN 5-325 MG PO TABS
1.0000 | ORAL_TABLET | ORAL | Status: DC | PRN
  Administered 2024-01-04: 1 via ORAL
  Filled 2024-01-03: qty 1

## 2024-01-03 MED ORDER — METOCLOPRAMIDE HCL 5 MG/ML IJ SOLN: 5.0000 mg | Freq: Three times a day (TID) | INTRAMUSCULAR | Status: DC | PRN

## 2024-01-03 MED ORDER — CEFAZOLIN SODIUM-DEXTROSE 2-4 GM/100ML-% IV SOLN
2.0000 g | Freq: Four times a day (QID) | INTRAVENOUS | Status: AC
  Administered 2024-01-03 – 2024-01-04 (×2): 2 g via INTRAVENOUS
  Filled 2024-01-03 (×2): qty 100

## 2024-01-03 MED ORDER — DEXAMETHASONE SODIUM PHOSPHATE 10 MG/ML IJ SOLN
INTRAMUSCULAR | Status: DC | PRN
  Administered 2024-01-03: 10 mg via INTRAVENOUS

## 2024-01-03 MED ORDER — LIDOCAINE 2% (20 MG/ML) 5 ML SYRINGE
INTRAMUSCULAR | Status: AC
  Filled 2024-01-03: qty 5

## 2024-01-03 MED ORDER — ROCURONIUM BROMIDE 10 MG/ML (PF) SYRINGE
PREFILLED_SYRINGE | INTRAVENOUS | Status: DC | PRN
  Administered 2024-01-03: 50 mg via INTRAVENOUS

## 2024-01-03 MED ORDER — MORPHINE SULFATE (PF) 2 MG/ML IV SOLN
0.5000 mg | INTRAVENOUS | Status: DC | PRN
  Administered 2024-01-04: 0.5 mg via INTRAVENOUS
  Filled 2024-01-03: qty 1

## 2024-01-03 MED ORDER — ENOXAPARIN SODIUM 40 MG/0.4ML IJ SOSY
40.0000 mg | PREFILLED_SYRINGE | INTRAMUSCULAR | Status: DC
  Administered 2024-01-04 – 2024-01-05 (×2): 40 mg via SUBCUTANEOUS
  Filled 2024-01-03 (×2): qty 0.4

## 2024-01-03 MED ORDER — OXYCODONE HCL 5 MG PO TABS: 5.0000 mg | ORAL_TABLET | Freq: Once | ORAL | Status: DC | PRN

## 2024-01-03 MED ORDER — DOCUSATE SODIUM 100 MG PO CAPS
100.0000 mg | ORAL_CAPSULE | Freq: Two times a day (BID) | ORAL | Status: DC
  Administered 2024-01-03 – 2024-01-05 (×4): 100 mg via ORAL
  Filled 2024-01-03 (×4): qty 1

## 2024-01-03 MED ORDER — MENTHOL 3 MG MT LOZG: 1.0000 | LOZENGE | OROMUCOSAL | Status: DC | PRN

## 2024-01-03 MED ORDER — CHLORHEXIDINE GLUCONATE 0.12 % MT SOLN
15.0000 mL | OROMUCOSAL | Status: AC
  Filled 2024-01-03: qty 15

## 2024-01-03 MED ORDER — ACETAMINOPHEN 325 MG PO TABS: 325.0000 mg | ORAL_TABLET | Freq: Four times a day (QID) | ORAL | Status: DC | PRN

## 2024-01-03 MED ORDER — FENTANYL CITRATE (PF) 100 MCG/2ML IJ SOLN
INTRAMUSCULAR | Status: AC
  Filled 2024-01-03: qty 2

## 2024-01-03 MED ORDER — AMISULPRIDE (ANTIEMETIC) 5 MG/2ML IV SOLN
INTRAVENOUS | Status: AC
  Filled 2024-01-03: qty 4

## 2024-01-03 MED ORDER — DOCUSATE SODIUM 100 MG PO CAPS: 200.0000 mg | ORAL_CAPSULE | Freq: Every day | ORAL | Status: DC

## 2024-01-03 MED ORDER — CEFAZOLIN SODIUM-DEXTROSE 2-4 GM/100ML-% IV SOLN
INTRAVENOUS | Status: AC
  Filled 2024-01-03: qty 100

## 2024-01-03 MED ORDER — PROPOFOL 10 MG/ML IV BOLUS
INTRAVENOUS | Status: DC | PRN
  Administered 2024-01-03: 140 mg via INTRAVENOUS
  Administered 2024-01-03: 200 ug/kg/min via INTRAVENOUS

## 2024-01-03 MED ORDER — SCOPOLAMINE 1 MG/3DAYS TD PT72
1.0000 | MEDICATED_PATCH | TRANSDERMAL | Status: DC
  Filled 2024-01-03: qty 1

## 2024-01-03 MED ORDER — AMISULPRIDE (ANTIEMETIC) 5 MG/2ML IV SOLN
10.0000 mg | Freq: Once | INTRAVENOUS | Status: AC | PRN
  Administered 2024-01-03: 10 mg via INTRAVENOUS

## 2024-01-03 SURGICAL SUPPLY — 42 items
BAG COUNTER SPONGE SURGICOUNT (BAG) IMPLANT
BIT DRILL CALIBRATED AFFXS 3.3 (DRILL) IMPLANT
CHLORAPREP W/TINT 26 (MISCELLANEOUS) ×1 IMPLANT
COVER SURGICAL LIGHT HANDLE (MISCELLANEOUS) ×1 IMPLANT
DRAPE INCISE IOBAN 66X45 STRL (DRAPES) ×1 IMPLANT
DRAPE SURG ORHT 6 SPLT 77X108 (DRAPES) ×2 IMPLANT
DRAPE U-SHAPE 47X51 STRL (DRAPES) ×1 IMPLANT
DRSG ADAPTIC 3X8 NADH LF (GAUZE/BANDAGES/DRESSINGS) ×1 IMPLANT
DRSG TEGADERM 4X4.75 (GAUZE/BANDAGES/DRESSINGS) IMPLANT
ELECTRODE REM PT RTRN 9FT ADLT (ELECTROSURGICAL) ×1 IMPLANT
GAUZE SPONGE 4X4 12PLY STRL (GAUZE/BANDAGES/DRESSINGS) IMPLANT
GLOVE BIO SURGEON STRL SZ7.5 (GLOVE) ×2 IMPLANT
GLOVE BIOGEL PI IND STRL 8 (GLOVE) ×2 IMPLANT
GOWN STRL REUS W/ TWL LRG LVL3 (GOWN DISPOSABLE) ×1 IMPLANT
GOWN STRL REUS W/ TWL XL LVL3 (GOWN DISPOSABLE) ×2 IMPLANT
GUIDEWIRE BALL NOSE AFFIXUS (WIRE) IMPLANT
KIT BASIN OR (CUSTOM PROCEDURE TRAY) ×1 IMPLANT
KIT TURNOVER KIT B (KITS) ×1 IMPLANT
KWIRE W/TROCAR TIP 2.5 (WIRE) IMPLANT
MANIFOLD NEPTUNE II (INSTRUMENTS) ×1 IMPLANT
NAIL PROX HUM AFFIXUS 7X160 RT (Nail) IMPLANT
NS IRRIG 1000ML POUR BTL (IV SOLUTION) ×1 IMPLANT
PACK SHOULDER (CUSTOM PROCEDURE TRAY) ×1 IMPLANT
PAD ARMBOARD POSITIONER FOAM (MISCELLANEOUS) ×2 IMPLANT
SCREW AFFIXUS BLUNT TIP 4X44 (Screw) IMPLANT
SCREW BLUNT TIP AFFIXUS 4X34 (Screw) IMPLANT
SCREW BLUNT TIP AFFIXUS 4X42 (Screw) IMPLANT
SCREW CORT AFFIXUS 4X24 (Screw) IMPLANT
SCREW CORT AFFIXUS 4X26 (Screw) IMPLANT
SLING ARM FOAM STRAP LRG (SOFTGOODS) IMPLANT
SLING ARM FOAM STRAP MED (SOFTGOODS) IMPLANT
SPONGE T-LAP 4X18 ~~LOC~~+RFID (SPONGE) IMPLANT
STRIP CLOSURE SKIN 1/2X4 (GAUZE/BANDAGES/DRESSINGS) IMPLANT
SUCTION TUBE FRAZIER 10FR DISP (SUCTIONS) ×1 IMPLANT
SUT MNCRL AB 4-0 PS2 18 (SUTURE) IMPLANT
SUT VIC AB 2-0 CT1 TAPERPNT 27 (SUTURE) ×1 IMPLANT
SUTURE FIBERWR #2 38 T-5 BLUE (SUTURE) IMPLANT
SYR CONTROL 10ML LL (SYRINGE) IMPLANT
TOWEL GREEN STERILE (TOWEL DISPOSABLE) ×1 IMPLANT
TOWEL GREEN STERILE FF (TOWEL DISPOSABLE) ×1 IMPLANT
WATER STERILE IRR 1000ML POUR (IV SOLUTION) ×1 IMPLANT
YANKAUER SUCT BULB TIP NO VENT (SUCTIONS) ×1 IMPLANT

## 2024-01-03 NOTE — Anesthesia Preprocedure Evaluation (Addendum)
 Anesthesia Evaluation  Patient identified by MRN, date of birth, ID band Patient awake    Reviewed: Allergy & Precautions, H&P , NPO status , Patient's Chart, lab work & pertinent test results  History of Anesthesia Complications (+) PONV and history of anesthetic complications  Airway Mallampati: II   Neck ROM: full    Dental   Pulmonary neg pulmonary ROS   breath sounds clear to auscultation       Cardiovascular hypertension (HCTZ), Pt. on medications  Rhythm:regular Rate:Normal  HLD   Neuro/Psych Syrinx of spinal cord    GI/Hepatic ,GERD  Medicated,,H/o diverticulitis   Endo/Other    Renal/GU      Musculoskeletal   Abdominal   Peds  Hematology Lab Results      Component                Value               Date                      WBC                      18.9 (H)            12/28/2023                HGB                      12.7                12/28/2023                HCT                      38.8                12/28/2023                MCV                      91.5                12/28/2023                PLT                      385                 12/28/2023              Anesthesia Other Findings   Reproductive/Obstetrics                              Anesthesia Physical Anesthesia Plan  ASA: 2  Anesthesia Plan: General   Post-op Pain Management: Regional block* and Tylenol  PO (pre-op)*   Induction:   PONV Risk Score and Plan: 4 or greater and Ondansetron , Dexamethasone , Treatment may vary due to age or medical condition and Midazolam   Airway Management Planned: Oral ETT  Additional Equipment:   Intra-op Plan:   Post-operative Plan: Extubation in OR  Informed Consent: I have reviewed the patients History and Physical, chart, labs and discussed the procedure including the risks, benefits and alternatives for the proposed anesthesia with the patient or authorized  representative who has indicated his/her understanding and acceptance.  Dental advisory given  Plan Discussed with: CRNA, Anesthesiologist and Surgeon  Anesthesia Plan Comments: (Discussed potential risks of nerve blocks including, but not limited to, infection, bleeding, nerve damage, seizures, pneumothorax, respiratory depression, and potential failure of the block. Alternatives to nerve blocks discussed. All questions answered.  Risks of general anesthesia discussed including, but not limited to, sore throat, hoarse voice, chipped/damaged teeth, injury to vocal cords, nausea and vomiting, allergic reactions, lung infection, heart attack, stroke, and death. All questions answered. )         Anesthesia Quick Evaluation

## 2024-01-03 NOTE — Brief Op Note (Signed)
 01/03/2024  5:14 PM  PATIENT:  Anita Shea  63 y.o. female  PRE-OPERATIVE DIAGNOSIS:  Right proximal humerus fracture  POST-OPERATIVE DIAGNOSIS:  Right proximal humerus fracture  PROCEDURE:  Procedure(s): INSERTION, INTRAMEDULLARY ROD, HUMERUS (Right)  SURGEON:  Surgeons and Role:    * Sharl Selinda Dover, MD - Primary  PHYSICIAN ASSISTANT: Dayle Moores, PA-C   ANESTHESIA:   regional and general  EBL:  50 mL   BLOOD ADMINISTERED:none  DRAINS: none   LOCAL MEDICATIONS USED:  NONE  SPECIMEN:  No Specimen  DISPOSITION OF SPECIMEN:  N/A  COUNTS:  YES  TOURNIQUET:  * No tourniquets in log *  DICTATION: .Note written in EPIC  PLAN OF CARE: Admit for overnight observation  PATIENT DISPOSITION:  PACU - hemodynamically stable.   Delay start of Pharmacological VTE agent (>24hrs) due to surgical blood loss or risk of bleeding: not applicable

## 2024-01-03 NOTE — H&P (Signed)
 ORTHOPAEDIC H and P  REQUESTING PHYSICIAN: Sharl Selinda Dover, MD  PCP:  Alphonsa Glendia LABOR, MD  Chief Complaint: Right humerus fracture  HPI: Anita DUTAN is a 63 y.o. female who complains of right shoulder pain.  Presents for right humerus IM nail for fracture.  No new complaints  Past Medical History:  Diagnosis Date   Complication of anesthesia    Diverticulitis of colon 08/30/2016   Hypertension    PONV (postoperative nausea and vomiting)    patient states Scope patch helps   Past Surgical History:  Procedure Laterality Date   CHOLECYSTECTOMY     1993, flares   COLONOSCOPY N/A 03/08/2017   Procedure: COLONOSCOPY;  Surgeon: Golda Claudis PENNER, MD;  Location: [facility];  Service: Endoscopy;  Laterality: N/A;  1200-rescheduled to 9/12 @ 1:00   COLONOSCOPY WITH PROPOFOL  N/A 06/27/2023   Procedure: COLONOSCOPY WITH PROPOFOL ;  Surgeon: Eartha Angelia Sieving, MD;  Location: [facility];  Service: Gastroenterology;  Laterality: N/A;  12:45PM;ASA 1   KNEE ARTHROPLASTY Right 11/16/2023   POLYPECTOMY  03/08/2017   Procedure: POLYPECTOMY;  Surgeon: Golda Claudis PENNER, MD;  Location: [facility];  Service: Endoscopy;;   POLYPECTOMY  06/27/2023   Procedure: POLYPECTOMY;  Surgeon: Eartha Angelia Sieving, MD;  Location: [facility];  Service: Gastroenterology;;   WISDOM TOOTH EXTRACTION     Social History   Socioeconomic History   Marital status: Married    Spouse name: Not on file   Number of children: 3   Years of education: Not on file   Highest education level: Not on file  Occupational History   Occupation: EMPLOYED  Tobacco Use   Smoking status: Never   Smokeless tobacco: Never  Vaping Use   Vaping status: Never Used  Substance and Sexual Activity   Alcohol use: No   Drug use: No   Sexual activity: Yes  Other Topics Concern   Not on file  Social History Narrative   Not on file   Social Drivers of Health   Financial Resource Strain: Not on  file  Food Insecurity: Not on file  Transportation Needs: Not on file  Physical Activity: Not on file  Stress: Not on file  Social Connections: Unknown (10/03/2022)   Received from [facility]   Social Network    Social Network: Not on file   Family History  Problem Relation Age of Onset   Stroke Mother    Lung cancer Mother    Heart disease Father    Heart attack Father    Healthy Other    Healthy Other    Diabetes Brother    Heart attack Brother    Hypertension Sister    Heart attack Maternal Grandfather    Heart attack Paternal Grandfather    Healthy Daughter    Allergies  Allergen Reactions   Aspirin Anaphylaxis   Lisinopril Cough   Nsaids Hives   Crestor  [Rosuvastatin ]     Patient had severe leg pain on several times with taking medication.  Even after a drug holiday and restarting medication she had leg pain with the medicine and not without   Prior to Admission medications   Medication Sig Start Date End Date Taking? Authorizing Provider  acetaminophen  (TYLENOL ) 500 MG tablet Take 1,000 mg by mouth every 6 (six) hours as needed for mild pain (pain score 1-3) or moderate pain (pain score 4-6).   Yes [provider]  cetirizine (ZYRTEC) 10 MG tablet Take 10 mg  by mouth daily.   Yes [provider]  cholecalciferol (VITAMIN D3) 25 MCG (1000 UNIT) tablet Take 1,000 Units by mouth daily.   Yes [provider]  cyanocobalamin  (VITAMIN B12) 1000 MCG tablet Take 1,000 mcg by mouth daily.   Yes [provider]  cyclobenzaprine  (FLEXERIL ) 10 MG tablet TAKE 1 TABLET BY MOUTH EVERY 8 HOURS AS NEEDED FOR MUSCLE PAIN/SPASM 12/21/23  Yes [provider]  docusate sodium  (COLACE) 100 MG capsule Take 2 capsules (200 mg total) by mouth daily. Patient taking differently: Take 200 mg by mouth at bedtime. 03/08/17  Yes Rehman, Claudis PENNER, MD  famotidine  (PEPCID ) 10 MG tablet Take 10-20 mg by mouth daily at 12 noon.   Yes [provider]   fluticasone (FLONASE) 50 MCG/ACT nasal spray Place 1 spray into both nostrils daily.   Yes [provider]  hydrochlorothiazide  (HYDRODIURIL ) 25 MG tablet Take 1 tablet by mouth once daily 12/08/23  Yes Luking, Glendia LABOR, MD  magnesium  chloride (SLOW-MAG) 64 MG TBEC SR tablet Take 1 tablet by mouth at bedtime.   Yes [provider]  omeprazole  (PRILOSEC  OTC) 20 MG tablet Take 20-40 mg by mouth daily at 6 (six) AM.   Yes [provider]  ondansetron  (ZOFRAN -ODT) 8 MG disintegrating tablet Take 1 tablet (8 mg total) by mouth every 8 (eight) hours as needed for nausea or vomiting. Patient taking differently: Take 4 mg by mouth every 8 (eight) hours as needed for nausea or vomiting. 10/25/23  Yes Luking, Glendia LABOR, MD  oxyCODONE  (ROXICODONE ) 5 MG immediate release tablet Take 1 tablet (5 mg total) by mouth every 4 (four) hours as needed for severe pain (pain score 7-10). 12/28/23  Yes Simon Lavonia SAILOR, MD  potassium chloride  SA (KLOR-CON  M) 20 MEQ tablet Take 1 tablet (20 mEq total) by mouth 2 (two) times daily. 07/04/23  Yes Alphonsa Glendia LABOR, MD  simethicone  (MYLICON) 125 MG chewable tablet Chew 250 mg by mouth at bedtime.   Yes [provider]  tretinoin  (RETIN-A ) 0.025 % cream USE A PEA-SIZED AMOUNT TWICE A WEEK AT NIGHT FOR THE FIRST MONTH, THEN INCREASE TO THREE TIMES A WEEK AT NIGHT 11/13/23  Yes Alm Delon SAILOR, DO  dicyclomine  (BENTYL ) 20 MG tablet TAKE 1 TABLET BY MOUTH THREE TIMES DAILY AS NEEDED FOR  ABDOMINAL  CRAMPING Patient not taking: Reported on 01/02/2024 09/18/23   Alphonsa Glendia LABOR, MD  pravastatin  (PRAVACHOL ) 20 MG tablet Take 1 tablet (20 mg total) by mouth daily. Patient not taking: Reported on 10/25/2023 05/18/23   Alphonsa Glendia LABOR, MD   No results found.  Positive ROS: All other systems have been reviewed and were otherwise negative with the exception of those mentioned in the HPI and as above.  Physical Exam: General: Alert, no acute  distress Cardiovascular: No pedal edema Respiratory: No cyanosis, no use of accessory musculature GI: No organomegaly, abdomen is soft and non-tender Skin: No lesions in the area of chief complaint Neurologic: Sensation intact distally Psychiatric: Patient is competent for consent with normal mood and affect Lymphatic: No axillary or cervical lymphadenopathy  MUSCULOSKELETAL :  RUE_  bruising noted.  NVI  Assessment: Right proximal humerus 2 part fracture  Plan: Plan for IMN today for early mobilization and WB  The risks, benefits, and alternatives were discussed with the patient. There are risks associated with the surgery including, but not limited to, problems with anesthesia (death), infection, differences in leg length/angulation/rotation, fracture of bones, loosening or failure  of implants, malunion, nonunion, hematoma (blood accumulation) which may require surgical drainage, blood clots, pulmonary embolism, nerve injury (foot drop), and blood vessel injury. The patient understands these risks and elects to proceed.   Possible dc post op from PACU if tolerating anesthesia without n/v.    Selinda Belvie Gosling, MD Cell [phone]    01/03/2024 3:19 PM

## 2024-01-03 NOTE — Discharge Instructions (Addendum)
 Orthopedic surgery discharge instructions:  -Maintain postoperative bandage until your follow-up appointment.  This bandage is waterproof and you may shower and that bandage beginning on postoperative day #2.  Please do not submerge the shoulder underwater.  -  No lifting above shoulder height for the first 4 weeks.  However, in physical therapy they will move your arm above shoulder height for you.  You may move the elbow, hand and wrist as tolerated.    - You are okay to bear full weight through the right upper extremity as tolerated.  Immediately may use the arm for activities of daily living.  -Apply ice to the operative shoulder along the surgical bandage for 20 to 30 minutes out of each hour.  Do this as frequently as possible throughout the day.  -For mild to moderate pain you should use Tylenol  and or Advil in alternating fashion.  Do this throughout the day and utilize hydrocodone  as needed for breakthrough pain. you should try to wean off of the hydrocodone  over the next 3-5 days.  -You will return to see Dr. Sharl in the office in 2 weeks for postoperative x-rays and wound check.

## 2024-01-03 NOTE — Anesthesia Procedure Notes (Signed)
 Anesthesia Regional Block: Interscalene brachial plexus block   Pre-Anesthetic Checklist: , timeout performed,  Correct Patient, Correct Site, Correct Laterality,  Correct Procedure, Correct Position, site marked,  Risks and benefits discussed,  Surgical consent,  Pre-op evaluation,  At surgeon's request and post-op pain management  Laterality: Right  Prep: chloraprep       Needles:  Injection technique: Single-shot  Needle Type: Echogenic Stimulator Needle     Needle Length: 5cm  Needle Gauge: 22     Additional Needles:   Procedures:, nerve stimulator,,,,,     Nerve Stimulator or Paresthesia:  Response: biceps flexion, 0.45 mA  Additional Responses:   Narrative:  Start time: 01/03/2024 3:43 PM End time: 01/03/2024 3:52 PM Injection made incrementally with aspirations every 5 mL.  Performed by: Personally  Anesthesiologist: Maryclare Cornet, MD  Additional Notes: Functioning IV was confirmed and monitors were applied.  A 50mm 22ga Arrow echogenic stimulator needle was used. Sterile prep and drape,hand hygiene and sterile gloves were used.  Negative aspiration and negative test dose prior to incremental administration of local anesthetic. The patient tolerated the procedure well.  Ultrasound guidance: relevent anatomy identified, needle position confirmed, local anesthetic spread visualized around nerve(s), vascular puncture avoided.  Image printed for medical record.

## 2024-01-03 NOTE — Op Note (Signed)
 Date of Surgery: 01/03/2024   INDICATIONS: Anita Shea is a very pleasant right-hand-dominant 63 year old female who had an unfortunate ground-level fall just a couple days ago.  This resulted in a right proximal humerus fracture with mild displacement as well as a displaced bimalleolar ankle fracture.  She 6 weeks status post right total knee arthroplasty.  Unfortunately given the combination of injury she is indicated for operative management of the humerus.  Here today for IM nail treatment of the right humerus.  Previously discussed risk benefits of the procedure in detail including not limited to bleeding, infection, damage to surrounding nerves and vessels, stiffness, persistent pain and weakness as well as the risk of anesthesia.  She has provided informed consent.    PREOPERATIVE DIAGNOSIS: Right proximal humerus fracture, 2 part, closed.   POSTOPERATIVE DIAGNOSIS: Same   PROCEDURE: Right humerus closed reduction with intramedullary nail treatment for fracture   SURGEON: Anita SHAUNNA Shea, M.D.   ASSISTANT: Anita Moores, Anita-C   Assistant attestation:   Anita Shea scrubbed and present for the entire procedure.,    ANESTHESIA:  general, interscalene with Exparel    IV FLUIDS AND URINE: See anesthesia record.   ESTIMATED BLOOD LOSS: 50 mL.   IMPLANTS:  Zimmer affixes humeral natural nail size 7 x 160 mm 3 proximal blunt-tipped interlocking screws 2 distal cortical screws   DRAINS: None.   COMPLICATIONS: None.   DESCRIPTION OF PROCEDURE: The patient was brought to the operating room and placed supine on the operating table.  The patient's arm had been signed prior to the procedure and this was documented.  The patient had the anesthesia placed by the anesthesiologist.  The prep verification and incision time-outs were performed to confirm that this was the correct patient, site, side and location. The patient had an SCD on bilateral lower extremity. The patient did receive  antibiotics prior to the incision and was re-dosed during the procedure as needed at indicated intervals.  The patient had the left arm prepped and draped in the standard surgical fashion.     At the [facility] joint,, the starting point was first found with the guide wire and this was inserted under fluoroscopic visualization. The guide wire was placed partially down into the humerus and then the incision was made in the skin and subcutaneous tissue to the fascia, also splitting the rotator cuff musculature in line with fibers and bluntly dissecting down to the humeral head and then the opening reamer was placed over this.  We then reamed up to a size 8 distal. Next, we malleted the nail into place and agreed that the length was appropriate on both AP and lateral fluoroscopic images.  Then, we placed our proximal  screws.  We did this in a percutaneous fashion through the drill guide that was placed in 3 separate locations through the jig for the nail.  This was done by first making a skin incision and then bluntly dissecting down to the lateral and anterior cortex of the humerus.  These 3 screws were placed in length were checked on fluoroscopic images.    In a similar fashion we were able to use the jig to place the 2 separate distal interlock screws.  Again position was confirmed on fluoroscopic AP and lateral intraoperative image intensification.  These were placed by hand after drilling first and then measuring.  We then took the jig off the nail and took final intraoperative fluoroscopic images.     Final intraoperative  fluoroscopic images were taken AP and lateral.  The arm was cleaned and copiously irrigated, then began closing the wounds in layers.  2-0 Monocryl for deep dermal layer and staples for skin.  The arm was cleaned and dried and sterile bandages were applied.  The arm was placed in a sling.  She was awoken from general anesthesia in stable condition.  She was transported from the operative table  onto the stretcher and then to PACU in stable condition.     The skin was closed with 3-0 Monocryl subcuticular.  The wounds were cleaned and dried a final time and a sterile dressing was placed. The patient was then transferred to a bed and left the operating room in stable condition.  All counts were correct at the end of the case.     POSTOPERATIVE PLAN:  Anita Shea will be immediately weightbearing as tolerated with the right upper extremity.  She can begin all range of motion below shoulder height immediately.  We will limit active elevation to shoulder height for the first 1 month, and then will be able to move forward as tolerated from there.  She can begin passive elevation above shoulder height immediately.  She will be admitted for postoperative pain control as well as nausea monitoring and receive physical therapy with walker use training, and will discharge home tomorrow.  I will see her back in the office in 2 weeks.

## 2024-01-03 NOTE — Anesthesia Procedure Notes (Signed)
 Procedure Name: Intubation Date/Time: 01/03/2024 4:06 PM  Performed by: Atanacio Arland HERO, CRNAPre-anesthesia Checklist: Patient identified, Emergency Drugs available, Suction available and Patient being monitored Patient Re-evaluated:Patient Re-evaluated prior to induction Oxygen Delivery Method: Circle System Utilized Preoxygenation: Pre-oxygenation with 100% oxygen Induction Type: IV induction Ventilation: Mask ventilation without difficulty Laryngoscope Size: Mac and 3 Grade View: Grade III Tube type: Oral Tube size: 7.0 mm Number of attempts: 1 Airway Equipment and Method: Stylet Placement Confirmation: ETT inserted through vocal cords under direct vision, positive ETCO2 and breath sounds checked- equal and bilateral Secured at: 22 cm Tube secured with: Tape Dental Injury: Teeth and Oropharynx as per pre-operative assessment

## 2024-01-03 NOTE — Transfer of Care (Signed)
 Immediate Anesthesia Transfer of Care Note  Patient: Anita Shea  Procedure(s) Performed: INSERTION, INTRAMEDULLARY ROD, HUMERUS, RIIGHT (Right: Shoulder)  Patient Location: PACU  Anesthesia Type:GA combined with regional for post-op pain  Level of Consciousness: drowsy  Airway & Oxygen Therapy: Patient Spontanous Breathing and Patient connected to nasal cannula oxygen  Post-op Assessment: Report given to RN and Post -op Vital signs reviewed and stable  Post vital signs: Reviewed and stable  Last Vitals:  Vitals Value Taken Time  BP 126/67 01/03/24 17:36  Temp 36.8 C 01/03/24 17:35  Pulse 95 01/03/24 17:38  Resp 17 01/03/24 17:38  SpO2 99 % 01/03/24 17:38  Vitals shown include unfiled device data.  Last Pain:  Vitals:   01/03/24 1525  PainSc: 3       Patients Stated Pain Goal: 0 (01/03/24 1525)  Complications: No notable events documented.

## 2024-01-04 DIAGNOSIS — S42221A 2-part displaced fracture of surgical neck of right humerus, initial encounter for closed fracture: Secondary | ICD-10-CM | POA: Diagnosis not present

## 2024-01-04 MED ORDER — CYCLOBENZAPRINE HCL 5 MG PO TABS
5.0000 mg | ORAL_TABLET | Freq: Three times a day (TID) | ORAL | Status: DC
  Administered 2024-01-04 – 2024-01-05 (×3): 5 mg via ORAL
  Filled 2024-01-04 (×3): qty 1

## 2024-01-04 NOTE — Evaluation (Signed)
 Physical Therapy Evaluation Patient Details Name: Anita Shea MRN: [id number] DOB: 1961/01/28 Today's Date: 01/04/2024  History of Present Illness  Anita Shea is a 63 y.o. female admitted 01/03/24 for surgical repair of RUE. Pt had a ground-level fall 7/3, tripping over her dogs. Imaging demonstrated right proximal humerus fracture and right ankle trimalleolar fracture with some dislocation. Ankle was reduced in ED 7/3. Pt s/p right humerus IM nail 7/9. PMHx: HTN, diverticulitis, GERD. Of note, pt underwent Rt TKA (~7 weeks ago).   Clinical Impression  Pt admitted with above diagnosis. Pt's PLOF was independent with functional mobility, ADLs, and IADLs. Since her fall 7/3 she has required 1+ assist with everything and has relied on a w/c to mobilize within her home. She lives with her husband in a mobile home with a ramped entrance. Pt currently with functional limitations due to the deficits listed below (see PT Problem List). Educated pt on her WBing status for RUE/RLE and her ROM restrictions for RUE. She required minA for bed mobility and CGA for transfers. Introduced RW and educated pt on proper sequencing and safe us  of AD during OOB mobility. Demonstrated technique and discussed how her family should be positioned to support her. Pt was able to complete a stand pivot transfer, but was unable to take steps. She reported inability to increase WBing through RUE on RW; therefore, will be dependent on a w/c. Pt will benefit from acute skilled PT to increase her independence and safety with mobility to allow discharge. Recommend HHPT to increase activity tolerance, improve balance, decrease fall risk, maximize independence with functional mobility, and optimize safety within the home environment.       If plan is discharge home, recommend the following: A little help with walking and/or transfers;A little help with bathing/dressing/bathroom;Assistance with cooking/housework;Assist for  transportation;Help with stairs or ramp for entrance   Can travel by private vehicle        Equipment Recommendations None recommended by PT (Pt already has DME)  Recommendations for Other Services       Functional Status Assessment Patient has had a recent decline in their functional status and demonstrates the ability to make significant improvements in function in a reasonable and predictable amount of time.     Precautions / Restrictions Precautions Precautions: Fall Recall of Precautions/Restrictions: Intact Precaution/Restrictions Comments: Reviwed WBing status and ROM restrictions for RUE. Required Braces or Orthoses: Splint/Cast Splint/Cast: RLE Splint/Cast - Date Prophylactic Dressing Applied (if applicable): 12/28/23 Restrictions Weight Bearing Restrictions Per Provider Order: Yes RUE Weight Bearing Per Provider Order: Weight bearing as tolerated RLE Weight Bearing Per Provider Order: Non weight bearing      Mobility  Bed Mobility Overal bed mobility: Needs Assistance Bed Mobility: Supine to Sit     Supine to sit: HOB elevated, Used rails, Min assist     General bed mobility comments: Pt sat up on L side of bed with increased time. Cues for sequencing. Assist to manage RLE, elevate trunk, and scoot hips towards EOB.    Transfers Overall transfer level: Needs assistance Equipment used: Rolling walker (2 wheels) Transfers: Sit to/from Stand, Bed to chair/wheelchair/BSC Sit to Stand: Contact guard assist           General transfer comment: Pt stood from lowest bed height. Cued proper hand placement using RW. Powered up with CGA. She transferred to recliner chair on her left by pivoting on her left foot. Cues for sequencing and manuevering of RW.    Ambulation/Gait  General Gait Details: Unable. Educated pt on technique to ambulate while maintaining RLE NWB. Pt reported inability to increase WBing through BUE support in order to take a  step with LLE.  Stairs            Wheelchair Mobility     Tilt Bed    Modified Rankin (Stroke Patients Only)       Balance Overall balance assessment: Needs assistance Sitting-balance support: Bilateral upper extremity supported, Feet supported Sitting balance-Leahy Scale: Fair Sitting balance - Comments: Pt sat EOB with supervision.   Standing balance support: Bilateral upper extremity supported, During functional activity, Reliant on assistive device for balance Standing balance-Leahy Scale: Poor Standing balance comment: Pt dependent on RW                             Pertinent Vitals/Pain Pain Assessment Pain Assessment: 0-10 Pain Score: 9  Pain Location: R shoulder Pain Descriptors / Indicators: Constant, Aching, Discomfort Pain Intervention(s): Monitored during session, Limited activity within patient's tolerance    Home Living Family/patient expects to be discharged to:: Private residence Living Arrangements: Spouse/significant other Available Help at Discharge: Family;Available PRN/intermittently;Available 24 hours/day (Husband is self-employed so has been able to stay with her full-time currently, hopeful to go back to work. Daughter able to assist in/out.) Type of Home: Mobile home Home Access: Stairs to enter;Ramped entrance   Entrance Stairs-Number of Steps: 2   Home Layout: One level Home Equipment: Best boy (2 wheels);Cane - quad;Shower seat;Toilet riser;Wheelchair - manual;Other (comment) (gait belt)      Prior Function Prior Level of Function : Independent/Modified Independent;Driving;History of Falls (last six months);Needs assist       Physical Assist : Mobility (physical);ADLs (physical) Mobility (physical): Transfers;Gait ADLs (physical): IADLs;Toileting;Dressing;Bathing;Grooming Mobility Comments: Prior to fall 7/3, independent with functional mobility without AD. 1 fall resulting fractures. Since the  fall, pt has required 1+ assistance with all mobility. She reports sleeping in a recliner chair and transferring with use of gait belt and her husband into w/c and relying on family to move her around. ADLs Comments: Prior to fall 7/3, independent with ADLs/IADLs. Since the fall, pt has required 1+ assistance with all ADLs/IADLs.     Extremity/Trunk Assessment   Upper Extremity Assessment Upper Extremity Assessment: Defer to OT evaluation    Lower Extremity Assessment Lower Extremity Assessment: RLE deficits/detail (LLE overall WFL for tasks assessed.) RLE Deficits / Details: Pt in splint from inferior knee to toes. She demonstrated ability to wiggle her toes. WFL hip/knee ROM. Grossly 3/5 strength. RLE: Unable to fully assess due to pain;Unable to fully assess due to immobilization    Cervical / Trunk Assessment Cervical / Trunk Assessment: Normal  Communication   Communication Communication: No apparent difficulties    Cognition Arousal: Alert Behavior During Therapy: WFL for tasks assessed/performed   PT - Cognitive impairments: No apparent impairments                       PT - Cognition Comments: Pt A,Ox4. Following commands: Intact       Cueing Cueing Techniques: Verbal cues, Gestural cues, Visual cues     General Comments General comments (skin integrity, edema, etc.): Pt initially with RUE in sling, informed her of WB status and ROM guidelines. Removed sling prior to initiating mobility. Educated pt/husband on positioning to safely support pt during mobility.    Exercises     Assessment/Plan  PT Assessment Patient needs continued PT services  PT Problem List Decreased strength;Decreased range of motion;Decreased activity tolerance;Decreased balance;Decreased mobility;Decreased knowledge of use of DME;Decreased safety awareness;Pain       PT Treatment Interventions DME instruction;Gait training;Functional mobility training;Therapeutic  activities;Therapeutic exercise;Balance training;Neuromuscular re-education;Cognitive remediation;Patient/family education;Wheelchair mobility training    PT Goals (Current goals can be found in the Care Plan section)  Acute Rehab PT Goals Patient Stated Goal: Return Home and be able to safely move around PT Goal Formulation: With patient/family Time For Goal Achievement: 01/11/24 Potential to Achieve Goals: Good    Frequency Min 2X/week     Co-evaluation               AM-PAC PT 6 Clicks Mobility  Outcome Measure Help needed turning from your back to your side while in a flat bed without using bedrails?: A Little Help needed moving from lying on your back to sitting on the side of a flat bed without using bedrails?: A Little Help needed moving to and from a bed to a chair (including a wheelchair)?: A Little Help needed standing up from a chair using your arms (e.g., wheelchair or bedside chair)?: A Little Help needed to walk in hospital room?: Total Help needed climbing 3-5 steps with a railing? : Total 6 Click Score: 14    End of Session Equipment Utilized During Treatment: Gait belt Activity Tolerance: Patient tolerated treatment well Patient left: in chair;with call bell/phone within reach;with chair alarm set;with family/visitor present Nurse Communication: Mobility status PT Visit Diagnosis: Pain;Difficulty in walking, not elsewhere classified (R26.2);Unsteadiness on feet (R26.81);Other abnormalities of gait and mobility (R26.89) Pain - Right/Left: Right Pain - part of body: Shoulder    Time: 8973-8890 PT Time Calculation (min) (ACUTE ONLY): 43 min   Charges:   PT Evaluation $PT Eval Moderate Complexity: 1 Mod PT Treatments $Therapeutic Activity: 23-37 mins PT General Charges $$ ACUTE PT VISIT: 1 Visit         Randall SAUNDERS, PT, DPT Acute Rehabilitation Services Office: [phone] Secure Chat Preferred  Delon CHRISTELLA Callander 01/04/2024, 12:56 PM

## 2024-01-04 NOTE — Progress Notes (Signed)
   Subjective:  Anita Shea is a 63 y.o. female, 1 Day Post-Op    s/p Procedure(s): INSERTION, INTRAMEDULLARY ROD, HUMERUS, RIIGHT   Patient reports pain as mild to moderate.  Noted doing ok, had just worked with therapy. Block wore off this morning causing severe pain. Better now after pain medication.  Denies n/t. Notes OT is coming soon. Passing gas, urinating ok.   Objective:   VITALS:   Vitals:   01/03/24 2225 01/04/24 0005 01/04/24 0426 01/04/24 0903  BP: (!) 152/79 (!) 154/76 135/67 (!) 147/75  Pulse: 100 99 87 94  Resp: 17 17 17    Temp: 98.7 F (37.1 C) 98.3 F (36.8 C) 98.6 F (37 C) 98.5 F (36.9 C)  TempSrc: Oral Oral Oral Oral  SpO2: 95% 98% 95% 90%  Weight:      Height:       In hospital chair NAD  RUE:  Neurologically intact Neurovascular intact Sensation intact distally Intact pulses distally Incision: dressing C/D/I Compartment soft Able to move, wrist, digits without difficulty. Difficulty doing active elbow flexion and extension - likely secondary to block . Painless passive and active elbow ROM.    RLE: calf soft in splint, splint intact. Able to wiggle toes, b/l calf soft , nontender.   Lab Results  Component Value Date   WBC 10.4 01/03/2024   HGB 11.3 (L) 01/03/2024   HCT 34.3 (L) 01/03/2024   MCV 90.7 01/03/2024   PLT 402 (H) 01/03/2024   BMET    Component Value Date/Time   NA 135 12/28/2023 1118   NA 142 10/16/2023 0901   K 3.2 (L) 12/28/2023 1118   CL 100 12/28/2023 1118   CO2 22 12/28/2023 1118   GLUCOSE 134 (H) 12/28/2023 1118   BUN 9 12/28/2023 1118   BUN 8 10/16/2023 0901   CREATININE 0.67 01/03/2024 1915   CALCIUM  8.9 12/28/2023 1118   EGFR 98 10/16/2023 0901   GFRNONAA >60 01/03/2024 1915   GFRNONAA 98 12/20/2023 1433     Assessment/Plan: 1 Day Post-Op   Principal Problem:   Closed 3-part fracture of proximal humerus, right, initial encounter   Advance diet Up with therapy Plan for discharge  tomorrow  Continue pain control with ordered medications Continue OT/PT through tomorrow. Plan for discharge tomorrow after second round of therapy to work on ambulation with walker, transfers.     Weightbearing Status: WBAT, no overhead active motion - limit to shoulder height. Ok for passive.  DVT Prophylaxis: Lovenox .    Adelaine Roppolo D Brennan Karam 01/04/2024, 12:11 PM  Dayle Moores PA-C  Physician Assistant with Dr. Sharl Gaba [place]

## 2024-01-04 NOTE — Anesthesia Postprocedure Evaluation (Signed)
 Anesthesia Post Note  Patient: Anita Shea  Procedure(s) Performed: INSERTION, INTRAMEDULLARY ROD, HUMERUS, RIIGHT (Right: Shoulder)     Patient location during evaluation: PACU Anesthesia Type: General and Regional Level of consciousness: awake and alert Pain management: pain level controlled Vital Signs Assessment: post-procedure vital signs reviewed and stable Respiratory status: spontaneous breathing, nonlabored ventilation, respiratory function stable and patient connected to nasal cannula oxygen Cardiovascular status: blood pressure returned to baseline and stable Postop Assessment: no apparent nausea or vomiting Anesthetic complications: no   No notable events documented.  Last Vitals:  Vitals:   01/04/24 0005 01/04/24 0426  BP: (!) 154/76 135/67  Pulse: 99 87  Resp: 17 17  Temp: 36.8 C 37 C  SpO2: 98% 95%    Last Pain:  Vitals:   01/04/24 0725  TempSrc:   PainSc: 7                  Korbyn Chopin S

## 2024-01-04 NOTE — TOC Initial Note (Signed)
 Transition of Care [facility]) - Initial/Assessment Note    Patient Details  Name: Anita Shea MRN: [id number] Date of Birth: 12-Nov-1960  Transition of [facility]) CM/SW Contact:    Rosalva Jon Bloch, RN Phone Number: 01/04/2024, 4:00 PM  Clinical Narrative:                   -right proximal humerus fracture and right ankle trimalleolar fracture    - s/p right humerus IM nail 7/9.  From home to home. PTA independent with ADL's , no DME usage.  Pt agreeable to home health services. Pt without provider  Expected Discharge Plan: Home w Home Health Services Barriers to Discharge: Continued Medical Work up   Patient Goals and CMS Choice     Choice offered to / list presented to : Patient      Expected Discharge Plan and Services   Discharge Planning Services: CM Consult   Living arrangements for the past 2 months: Single Family Home Expected Discharge Date: 01/05/24                         HH Arranged: PT, OT HH Agency: [facility] Date [facility] Agency Contacted: 01/04/24 Time HH Agency Contacted: 1550 Representative spoke with at [facility] Agency: Amy  Prior Living Arrangements/Services Living arrangements for the past 2 months: Single Family Home Lives with:: Spouse Patient language and need for interpreter reviewed:: Yes Do you feel safe going back to the place where you live?: Yes      Need for Family Participation in Patient Care: Yes (Comment) Care giver support system in place?: Yes (comment)   Criminal Activity/Legal Involvement Pertinent to Current Situation/Hospitalization: No - Comment as needed  Activities of Daily Living   ADL Screening (condition at time of admission) Independently performs ADLs?: Yes (appropriate for developmental age) Is the patient deaf or have difficulty hearing?: No Does the patient have difficulty seeing, even when wearing glasses/contacts?: No Does the patient have difficulty concentrating, remembering, or making decisions?:  No  Permission Sought/Granted                  Emotional Assessment       Orientation: : Oriented to Self, Oriented to Place, Oriented to  Time, Oriented to Situation Alcohol / Substance Use: Not Applicable Psych Involvement: No (comment)  Admission diagnosis:  Closed 3-part fracture of proximal humerus, right, initial encounter [D57.708J] Patient Active Problem List   Diagnosis Date Noted   Closed 3-part fracture of proximal humerus, right, initial encounter 01/03/2024   Myalgia due to statin 04/25/2023   History of colonic diverticulitis 03/19/2020   GERD (gastroesophageal reflux disease) 03/19/2020   Elevated serum immunoglobulin free light chain level 01/28/2020   Diverticulitis of colon 08/30/2016   Hyperlipidemia 10/31/2013   Syrinx of spinal cord (HCC) 09/21/2012   Essential hypertension, benign 09/21/2012   Dyspepsia 09/21/2012   PCP:  Alphonsa Glendia LABOR, MD Pharmacy:   [facility] [address], [place] - [address] [place] [address] Phone: [phone] Fax: [phone]  [facility] - [place], [place] - [address] [place] [address] Phone: [phone] Fax: [phone]     Social Drivers of Health (SDOH) Social History: SDOH Screenings   Food Insecurity: No Food Insecurity (01/04/2024)  Housing: Low Risk  (01/04/2024)  Transportation Needs: No Transportation Needs (01/04/2024)  Utilities: Not At Risk (01/04/2024)  Depression (PHQ2-9):  Low Risk  (12/02/2022)  Social Connections: Unknown (10/03/2022)   Received from [facility]  Tobacco Use: Low Risk  (01/03/2024)   SDOH Interventions:     Readmission Risk Interventions     No data to display

## 2024-01-04 NOTE — Evaluation (Signed)
 Occupational Therapy Evaluation Patient Details Name: Anita Shea MRN: [id number] DOB: 1961-06-05 Today's Date: 01/04/2024   History of Present Illness   Anita Shea is a 63 y.o. female admitted 01/03/24 for surgical repair of RUE. Pt had a ground-level fall 7/3, tripping over her dogs. Imaging demonstrated right proximal humerus fracture and right ankle trimalleolar fracture with some dislocation. Ankle was reduced in ED 7/3. Pt s/p right humerus IM nail 7/9. PMHx: HTN, diverticulitis, GERD. Of note, pt underwent Rt TKA (~7 weeks ago).     Clinical Impressions At baseline, pt is Independent with ADLs, IADLs, and functional mobility without an AD. Pt now presents with decreased activity tolerance, impaired balance, decreased knowledge of use of DME/AE, decreased knowledge of precautions, pain affecting functional level, impaired UE functional use, and decreased safety and independence with functional tasks. Pt now largely completing ADLs with Min to Max assist and functional transfers with a RW with Min assist, all while adhering to R UE and R LE precautions. OT educated pt and her husband in  R UE precautions, reviewed R LE precautions, compensatory strategies for ADLs while adhering to precautions, and in R UE AROM/PROM HEP within precautions with handout provided (MedBridge access code: [email or web address]). Pt and her husband verbalized understanding of all education and will benefit from reinforcement of education. Pt participated well in session and has good family support. Pt will benefit from acute skilled OT services to address deficits below and increase safety and independence with functional tasks. Post acute discharge, pt will benefit from [facility] OT to maximize rehab potential paired with 24/7 assistance of family.      If plan is discharge home, recommend the following:   A little help with walking and/or transfers;A lot of help with bathing/dressing/bathroom;Assistance with  cooking/housework;Assist for transportation;Help with stairs or ramp for entrance     Functional Status Assessment   Patient has had a recent decline in their functional status and demonstrates the ability to make significant improvements in function in a reasonable and predictable amount of time.     Equipment Recommendations   None recommended by OT (Pt already has needed equipment)     Recommendations for Other Services         Precautions/Restrictions   Precautions Precautions: Fall Recall of Precautions/Restrictions: Intact Precaution/Restrictions Comments: Reviwed WBing status and ROM restrictions for RUE. Required Braces or Orthoses: Splint/Cast Splint/Cast: RLE Splint/Cast - Date Prophylactic Dressing Applied (if applicable): 12/28/23 Restrictions Weight Bearing Restrictions Per Provider Order: Yes RUE Weight Bearing Per Provider Order: Weight bearing as tolerated RLE Weight Bearing Per Provider Order: Non weight bearing Other Position/Activity Restrictions: R shoulder AROM flexion to shoulder: R shoulder PROM flexion above shoulder     Mobility Bed Mobility Overal bed mobility: Needs Assistance             General bed mobility comments: Pt sitting in recliner at beginning and end of session    Transfers Overall transfer level: Needs assistance Equipment used: Rolling walker (2 wheels) Transfers: Sit to/from Stand, Bed to chair/wheelchair/BSC Sit to Stand: Min assist     Step pivot transfers: Min assist     General transfer comment: Powered up with Min assist from recliner and BSC. Cues for sequencing and manuevering of RW.      Balance Overall balance assessment: Needs assistance Sitting-balance support: Bilateral upper extremity supported, Feet supported Sitting balance-Leahy Scale: Fair     Standing balance support: Bilateral upper extremity supported, During functional activity, Reliant on assistive device  for balance Standing  balance-Leahy Scale: Poor Standing balance comment: Pt dependent on RW                           ADL either performed or assessed with clinical judgement   ADL Overall ADL's : Needs assistance/impaired Eating/Feeding: Minimal assistance;Sitting (adhering to R UE precautions)   Grooming: Minimal assistance;Sitting;Cueing for compensatory techniques (adhering to R UE precautions)   Upper Body Bathing: Moderate assistance;Sitting;Cueing for compensatory techniques (adhering to R UE precautions)   Lower Body Bathing: Maximal assistance;Sitting/lateral leans;Sit to/from stand;Cueing for compensatory techniques (adhering to R UE precautions)   Upper Body Dressing : Moderate assistance;Sitting;Cueing for compensatory techniques (adhering to R UE precautions)   Lower Body Dressing: Maximal assistance;Sit to/from stand;Sitting/lateral leans;Cueing for compensatory techniques (adhering to R UE precautions)   Toilet Transfer: Minimal assistance;Cueing for safety;Stand-pivot;Rolling walker (2 wheels);BSC/3in1 (adhering to R UE and R LE precautions; cues for hand placement/technique)   Toileting- Clothing Manipulation and Hygiene: Maximal assistance;Sit to/from stand;Cueing for compensatory techniques (adhering to R UE precautions)         General ADL Comments: Educated pt and husband in and demonstrated shower transfer technique using shower chair and adhering to R UE and R LE precautions with both verbalizing understanding. Shower transfer not yet trialed with pt. OT also edcuated pt and husband in positioning of R UE in chair and in bed to provide proper support and for comfort with both demonstrating understanding through teach back.     Vision Baseline Vision/History: 1 Wears glasses (readers) Ability to See in Adequate Light: 0 Adequate (with glasses) Patient Visual Report: No change from baseline       Perception         Praxis         Pertinent Vitals/Pain Pain  Assessment Pain Assessment: Faces Faces Pain Scale: Hurts whole lot Pain Location: R shoulder Pain Descriptors / Indicators: Constant, Aching, Discomfort, Grimacing Pain Intervention(s): Limited activity within patient's tolerance, Monitored during session, Repositioned, RN gave pain meds during session (Educated in R UE precautions and importance of continued gentle functional use/participation in HEP of R UE within precautions to decrease stiffness, maintain functional strength, and improve circulation)     Extremity/Trunk Assessment Upper Extremity Assessment Upper Extremity Assessment: Right hand dominant;RUE deficits/detail RUE Deficits / Details: R proximal humerous fx now s/p right humerus IM nail 7/9; PROM shoulder flexion WFL; AAROM of elbow WFL; AROM of wrist and hand WFL; pt with very limited shoulder and elbow AROM this session- suspect related to nerve block during procedure on 7/9; pain in shoulder at rest and with ROM; impaired sensation and gross motor coordinaiton- suspect related to nerve block during procedure on 7/9 RUE: Shoulder pain at rest;Shoulder pain with ROM RUE Sensation: decreased proprioception;decreased light touch (suspect related to nerve block during procedure on 7/9) RUE Coordination: decreased gross motor   Lower Extremity Assessment Lower Extremity Assessment: Defer to PT evaluation   Cervical / Trunk Assessment Cervical / Trunk Assessment: Normal   Communication Communication Communication: No apparent difficulties   Cognition Arousal: Alert Behavior During Therapy: WFL for tasks assessed/performed, Anxious (Largely WFL but with signs of increased anxiety when discussing funcitonal use/movement of R UE within precautions) Cognition: No apparent impairments             OT - Cognition Comments: Pt AAOx4 and pleasant throughout session. Cognition WFL with tasks assessed. Cognition not formally screened or assessed.  Following commands: Intact       Cueing  General Comments   Cueing Techniques: Verbal cues;Gestural cues;Visual cues  Pt's husband present during the majority of session. RN present during a portion of session.   Exercises Exercises: Other exercises, General Upper Extremity, Hand exercises General Exercises - Upper Extremity Shoulder Flexion: Right, Seated, 10 reps, AAROM, PROM Elbow Flexion: AAROM, Right, 10 reps, Seated Elbow Extension: AAROM, Right, 10 reps, Seated Wrist Flexion: AROM, Right, 10 reps, Seated Wrist Extension: AROM, Right, 10 reps, Seated Hand Exercises Forearm Supination: AROM, Right, 10 reps, Seated Forearm Pronation: AROM, Right, 10 reps, Seated Wrist Ulnar Deviation: AROM, Right, 10 reps, Seated Wrist Radial Deviation: AROM, Right, 10 reps, Seated Opposition: AROM, Right, 10 reps, Seated Other Exercises Other Exercises: Educated pt in and provided handout for R UE AROM/PROM HEP (MedBridge Access Code: [email or web address])   Shoulder Instructions      Home Living Family/patient expects to be discharged to:: Private residence Living Arrangements: Spouse/significant other Available Help at Discharge: Family;Available PRN/intermittently;Available 24 hours/day (Husband is self-employed so has been able to stay with her full-time currently, hopeful to go back to work. Daughter able to assist in/out.) Type of Home: Mobile home Home Access: Stairs to enter;Ramped entrance Entrance Stairs-Number of Steps: 2   Home Layout: One level     Bathroom Shower/Tub: Walk-in shower;Curtain (walk-in shower has a lip of about 4 inches)   Bathroom Toilet: Standard     Home Equipment: Best boy (2 wheels);Cane - quad;Shower seat;Toilet riser;Wheelchair - manual;Other (comment) (gait belt)          Prior Functioning/Environment Prior Level of Function : Independent/Modified Independent;Driving;History of Falls (last six months);Needs assist              Mobility Comments: Prior to fall 7/3, independent with functional mobility without AD. 1 fall resulting fractures. Since the fall, pt has required 1+ assistance with all mobility. She reports sleeping in a recliner chair and transferring with use of gait belt assist of her husband into w/c and relying on family to move her around. ADLs Comments: Prior to fall 7/3, independent with ADLs/IADLs and driving. Since the fall, pt has required 1+ assistance with all ADLs/IADLs.    OT Problem List: Decreased activity tolerance;Impaired balance (sitting and/or standing);Decreased coordination;Decreased knowledge of use of DME or AE;Decreased knowledge of precautions;Pain;Impaired UE functional use   OT Treatment/Interventions: Self-care/ADL training;Therapeutic exercise;Energy conservation;DME and/or AE instruction;Therapeutic activities;Patient/family education;Balance training      OT Goals(Current goals can be found in the care plan section)   Acute Rehab OT Goals Patient Stated Goal: to heal well and return home OT Goal Formulation: With patient/family Time For Goal Achievement: 01/18/24 Potential to Achieve Goals: Good ADL Goals Pt Will Perform Grooming: with supervision;with contact guard assist;sitting (adhering to R UE precautions; with adaptive equipment as needed) Pt Will Perform Upper Body Dressing: with supervision;sitting (adhering to R UE precautions) Pt Will Perform Lower Body Dressing: with min assist;sitting/lateral leans;sit to/from stand (adhering to R UE and R LE precautions; with adaptive equipment as needed) Pt Will Transfer to Toilet: with supervision;stand pivot transfer;bedside commode (adhering to R UE and R LE precautions; with least restrictive AD) Pt Will Perform Toileting - Clothing Manipulation and hygiene: with contact guard assist;sitting/lateral leans;sit to/from stand (adhering to R UE and R LE precautions) Pt Will Perform Tub/Shower Transfer: Shower  transfer;with contact guard assist;Stand pivot transfer;shower seat (adhering to R UE and R LE precautions; with least restrictive AD) Pt/caregiver will Perform  Home Exercise Program: Increased ROM;Right Upper extremity;With Supervision;With written HEP provided (PROM/AROM; adhering to R UE precautions)   OT Frequency:  Min 2X/week    Co-evaluation              AM-PAC OT 6 Clicks Daily Activity     Outcome Measure Help from another person eating meals?: A Little Help from another person taking care of personal grooming?: A Little Help from another person toileting, which includes using toliet, bedpan, or urinal?: A Lot Help from another person bathing (including washing, rinsing, drying)?: A Lot Help from another person to put on and taking off regular upper body clothing?: A Lot Help from another person to put on and taking off regular lower body clothing?: A Lot 6 Click Score: 14   End of Session Equipment Utilized During Treatment: Gait belt;Rolling walker (2 wheels);Other (comment) [facility] Nurse Communication: Mobility status  Activity Tolerance: Patient tolerated treatment well Patient left: in chair;with call bell/phone within reach;with chair alarm set;with family/visitor present  OT Visit Diagnosis: Unsteadiness on feet (R26.81);Other abnormalities of gait and mobility (R26.89);Muscle weakness (generalized) (M62.81);Pain                Time: 1240-1341 OT Time Calculation (min): 61 min Charges:  OT General Charges $OT Visit: 1 Visit OT Evaluation $OT Eval Moderate Complexity: 1 Mod OT Treatments $Self Care/Home Management : 23-37 mins $Therapeutic Exercise: 8-22 mins  Margarie Rockey HERO., OTR/L, MA Acute Rehab [phone]   Margarie FORBES Horns 01/04/2024, 2:31 PM

## 2024-01-05 ENCOUNTER — Encounter (HOSPITAL_COMMUNITY): Payer: Self-pay | Admitting: Orthopedic Surgery

## 2024-01-05 DIAGNOSIS — S42221A 2-part displaced fracture of surgical neck of right humerus, initial encounter for closed fracture: Secondary | ICD-10-CM | POA: Diagnosis not present

## 2024-01-05 NOTE — Progress Notes (Signed)
 Discharge instructions reviewed with pt and her husband. Copy of instructions given to pt. Pt informed her scripts were sent to her pharmacy for pick up.  Pt will be d/c'd via wheelchair with belongings and will be escorted by staff.   Dhani Dannemiller,RN SWOT

## 2024-01-05 NOTE — TOC Transition Note (Signed)
 Transition of Care [facility]) - Discharge Note   Patient Details  Name: Anita Shea MRN: [id number] Date of Birth: 1961-05-15  Transition of Care [facility]) CM/SW Contact:  Rosalva Jon Bloch, RN Phone Number: 01/05/2024, 12:36 PM   Clinical Narrative:    Patient will DC to: Home  Anticipated DC date: 01/05/2024 Family notified:yes Transport by: car     - s/p right humerus IM nail 7/9  Per MD patient ready for DC today. RN, patient, patient's husband  aware of DC.   Post hospital f/u noted on AVS.  Pt without RX med concerns.  Husband to provide transportation to home.  RNCM will sign off for now as intervention is no longer needed. Please consult us  again if new needs arise.    Final next level of care: Home w Home Health Services Barriers to Discharge: No Barriers Identified   Patient Goals and CMS Choice     Choice offered to / list presented to : Patient      Discharge Placement                       Discharge Plan and Services Additional resources added to the After Visit Summary for     Discharge Planning Services: CM Consult                      HH Arranged: PT, OT [facility] Agency: [facility] Date [facility] Agency Contacted: 01/04/24 Time HH Agency Contacted: 1550 Representative spoke with at [facility] Agency: Amy  Social Drivers of Health (SDOH) Interventions SDOH Screenings   Food Insecurity: No Food Insecurity (01/04/2024)  Housing: Low Risk  (01/04/2024)  Transportation Needs: No Transportation Needs (01/04/2024)  Utilities: Not At Risk (01/04/2024)  Depression (PHQ2-9): Low Risk  (12/02/2022)  Social Connections: Unknown (10/03/2022)   Received from [facility]  Tobacco Use: Low Risk  (01/03/2024)     Readmission Risk Interventions     No data to display

## 2024-01-05 NOTE — Discharge Summary (Signed)
 Patient ID: Anita Shea MRN: [id number] DOB/AGE: Sep 18, 1960 63 y.o.  Admit date: 01/03/2024 Discharge date: 01/05/2024  Primary Diagnosis: Right proximal humerus fracture Admission Diagnoses: s/p Right IM humeral nail for fracture Past Medical History:  Diagnosis Date   Complication of anesthesia    Diverticulitis of colon 08/30/2016   Hypertension    PONV (postoperative nausea and vomiting)    patient states Scope patch helps   Discharge Diagnoses:   Principal Problem:   Closed 3-part fracture of proximal humerus, right, initial encounter  Estimated body mass index is 26.94 kg/m as calculated from the following:   Height as of this encounter: 5' 7 (1.702 m).   Weight as of this encounter: 78 kg.  Procedure:  Procedure(s) (LRB): INSERTION, INTRAMEDULLARY ROD, HUMERUS, RIIGHT (Right)   Consults: None  HPI: Anita Shea is a 63 year old female who suffered a right proximal humerus fracture and right ankle fracture on 12/28/2022.  Of note she was also only 6 weeks status post right total knee arthroplasty.  She was evaluated and stabilized in the emergency room and followed up outpatient for her right proximal humerus and with her difficulty with ambulation secondary to polytrauma she was indicated for right humerus IM nail to allow immediate weightbearing.  She presented to the hospital for right IM nail of the humerus on 01/03/2024.  She was then admitted for postoperative pain control and physical therapy.  Laboratory Data: Admission on 01/03/2024  Component Date Value Ref Range Status   WBC 01/03/2024 10.4  4.0 - 10.5 K/uL Final   RBC 01/03/2024 3.78 (L)  3.87 - 5.11 MIL/uL Final   Hemoglobin 01/03/2024 11.3 (L)  12.0 - 15.0 g/dL Final   HCT 92/90/7974 34.3 (L)  36.0 - 46.0 % Final   MCV 01/03/2024 90.7  80.0 - 100.0 fL Final   MCH 01/03/2024 29.9  26.0 - 34.0 pg Final   MCHC 01/03/2024 32.9  30.0 - 36.0 g/dL Final   RDW 92/90/7974 12.6  11.5 - 15.5 % Final   Platelets  01/03/2024 402 (H)  150 - 400 K/uL Final   nRBC 01/03/2024 0.0  0.0 - 0.2 % Final   Performed at [facility], [address], [place], [place] [address]   Creatinine, Ser 01/03/2024 0.67  0.44 - 1.00 mg/dL Final   GFR, Estimated 01/03/2024 >60  >60 mL/min Final   Comment: (NOTE) Calculated using the CKD-EPI Creatinine Equation (2021) Performed at [facility], [address] [place], [place] [address]   Admission on 12/28/2023, Discharged on 12/28/2023  Component Date Value Ref Range Status   WBC 12/28/2023 18.9 (H)  4.0 - 10.5 K/uL Final   RBC 12/28/2023 4.24  3.87 - 5.11 MIL/uL Final   Hemoglobin 12/28/2023 12.7  12.0 - 15.0 g/dL Final   HCT 92/96/7974 38.8  36.0 - 46.0 % Final   MCV 12/28/2023 91.5  80.0 - 100.0 fL Final   MCH 12/28/2023 30.0  26.0 - 34.0 pg Final   MCHC 12/28/2023 32.7  30.0 - 36.0 g/dL Final   RDW 92/96/7974 13.1  11.5 - 15.5 % Final   Platelets 12/28/2023 385  150 - 400 K/uL Final   nRBC 12/28/2023 0.0  0.0 - 0.2 % Final   Neutrophils Relative % 12/28/2023 85  % Final   Neutro Abs 12/28/2023 16.1 (H)  1.7 - 7.7 K/uL Final   Lymphocytes Relative 12/28/2023 9  % Final   Lymphs Abs 12/28/2023 1.6  0.7 - 4.0 K/uL  Final   Monocytes Relative 12/28/2023 5  % Final   Monocytes Absolute 12/28/2023 0.9  0.1 - 1.0 K/uL Final   Eosinophils Relative 12/28/2023 0  % Final   Eosinophils Absolute 12/28/2023 0.1  0.0 - 0.5 K/uL Final   Basophils Relative 12/28/2023 0  % Final   Basophils Absolute 12/28/2023 0.1  0.0 - 0.1 K/uL Final   Immature Granulocytes 12/28/2023 1  % Final   Abs Immature Granulocytes 12/28/2023 0.14 (H)  0.00 - 0.07 K/uL Final   Performed at [facility], [address] [place], [place] [address]   Sodium 12/28/2023 135  135 - 145 mmol/L Final   Potassium 12/28/2023 3.2 (L)  3.5 - 5.1 mmol/L Final   Chloride 12/28/2023 100  98 - 111 mmol/L Final   CO2 12/28/2023 22  22 - 32 mmol/L Final   Glucose, Bld 12/28/2023 134  (H)  70 - 99 mg/dL Final   Glucose reference range applies only to samples taken after fasting for at least 8 hours.   BUN 12/28/2023 9  8 - 23 mg/dL Final   Creatinine, Ser 12/28/2023 0.57  0.44 - 1.00 mg/dL Final   Calcium  12/28/2023 8.9  8.9 - 10.3 mg/dL Final   Total Protein 92/96/7974 7.9  6.5 - 8.1 g/dL Final   Albumin 92/96/7974 4.0  3.5 - 5.0 g/dL Final   AST 92/96/7974 21  15 - 41 U/L Final   ALT 12/28/2023 18  0 - 44 U/L Final   Alkaline Phosphatase 12/28/2023 75  38 - 126 U/L Final   Total Bilirubin 12/28/2023 0.4  0.0 - 1.2 mg/dL Final   GFR, Estimated 12/28/2023 >60  >60 mL/min Final   Comment: (NOTE) Calculated using the CKD-EPI Creatinine Equation (2021)    Anion gap 12/28/2023 13  5 - 15 Final   Performed at [facility], [address], [place], [place] [address]  Scanned Document on 12/27/2023  Component Date Value Ref Range Status   EGFR (Non-African Amer.) 12/20/2023 98   Final   abst by him     X-Rays:DG Humerus Right Result Date: 01/03/2024 CLINICAL DATA:  Elective surgery. EXAM: RIGHT HUMERUS - 2+ VIEW COMPARISON:  12/28/2023 FINDINGS: Two fluoroscopic spot views of the right humerus submitted from the operating room. Humeral intramedullary nail with proximal and distal locking screw fixation traverse proximal humeral fracture. Fluoroscopy time 60 seconds. Dose 6.89 mGy. IMPRESSION: Intraoperative fluoroscopy during proximal humeral fracture fixation. Electronically Signed   By: Andrea Gasman M.D.   On: 01/03/2024 18:02   DG C-Arm 1-60 Min-No Report Result Date: 01/03/2024 Fluoroscopy was utilized by the requesting physician.  No radiographic interpretation.   CT Ankle Right Wo Contrast Result Date: 12/28/2023 CLINICAL DATA:  pre op planning. s/p  Reduction of ankle EXAM: CT OF THE RIGHT ANKLE WITHOUT CONTRAST TECHNIQUE: Multidetector CT imaging of the right ankle was performed according to the standard protocol. Multiplanar CT image  reconstructions were also generated. RADIATION DOSE REDUCTION: This exam was performed according to the departmental dose-optimization program which includes automated exposure control, adjustment of the mA and/or kV according to patient size and/or use of iterative reconstruction technique. COMPARISON:  X-ray right tibia fibula and ankle FINDINGS: Bones/Joint/Cartilage Redemonstration of acute trimalleolar displaced and comminuted fracture of the ankle. No new fracture identified. The posterior malleolar fracture is lateral. There is an acute avulsion fracture of the anterior inferior tibia (13:98.). No dislocation. No acute fracture dislocation of the bones of the foot. Ligaments  Suboptimally assessed by CT. Muscles and Tendons Grossly unremarkable. Soft tissues Subcutaneus soft tissue edema. IMPRESSION: 1. Redemonstration of acute trimalleolar displaced and comminuted fracture of the ankle. 2. Acute avulsion fracture of the anterior inferior tibia. Electronically Signed   By: Morgane  Naveau M.D.   On: 12/28/2023 17:39   DG Tibia/Fibula Right Result Date: 12/28/2023 CLINICAL DATA:  fall. Trauma; s/p reduction EXAM: RIGHT TIBIA AND FIBULA - 2 VIEW; RIGHT ANKLE - COMPLETE 3+ VIEW COMPARISON:  X-ray right knee 04/28/2023, x-ray tibia fibula right 12/28/2023 FINDINGS: Improved anatomical alignment of the ankle joint with interval reduction of prior tibiotalar dislocation. Redemonstration of acute trimalleolar displaced and comminuted fracture of the ankle with improved alignment. No other definite acute fracture or dislocation of the bones of the tibia and fibula proximally. Total knee arthroplasty with an 11 mm density within the tibiofemoral joint space of unclear etiology-finding could represent a knee fracture fragment. Small knee joint effusion. No aggressive appearing focal bone lesions. Soft tissues are unremarkable. Splint overlies bones. IMPRESSION: 1. Improved anatomical alignment of the ankle joint with  interval reduction of prior tibiotalar dislocation. 2. Redemonstration of acute trimalleolar displaced and comminuted fracture of the ankle with improved alignment. 3. Total knee arthroplasty with an 11 mm density within the tibiofemoral joint space of unclear etiology-finding could represent a knee fracture fragment. 4. Small knee joint effusion. Electronically Signed   By: Morgane  Naveau M.D.   On: 12/28/2023 15:52   DG Ankle Complete Right Result Date: 12/28/2023 CLINICAL DATA:  fall. Trauma; s/p reduction EXAM: RIGHT TIBIA AND FIBULA - 2 VIEW; RIGHT ANKLE - COMPLETE 3+ VIEW COMPARISON:  X-ray right knee 04/28/2023, x-ray tibia fibula right 12/28/2023 FINDINGS: Improved anatomical alignment of the ankle joint with interval reduction of prior tibiotalar dislocation. Redemonstration of acute trimalleolar displaced and comminuted fracture of the ankle with improved alignment. No other definite acute fracture or dislocation of the bones of the tibia and fibula proximally. Total knee arthroplasty with an 11 mm density within the tibiofemoral joint space of unclear etiology-finding could represent a knee fracture fragment. Small knee joint effusion. No aggressive appearing focal bone lesions. Soft tissues are unremarkable. Splint overlies bones. IMPRESSION: 1. Improved anatomical alignment of the ankle joint with interval reduction of prior tibiotalar dislocation. 2. Redemonstration of acute trimalleolar displaced and comminuted fracture of the ankle with improved alignment. 3. Total knee arthroplasty with an 11 mm density within the tibiofemoral joint space of unclear etiology-finding could represent a knee fracture fragment. 4. Small knee joint effusion. Electronically Signed   By: Morgane  Naveau M.D.   On: 12/28/2023 15:52   DG Wrist Complete Right Result Date: 12/28/2023 CLINICAL DATA:  Pain after fall. EXAM: RIGHT WRIST - COMPLETE 3+ VIEW COMPARISON:  None Available. FINDINGS: There is no evidence of acute  fracture or dislocation. The carpal rows appear intact. Moderate first CMC osteoarthritis. No significant soft tissue abnormalities are seen. IMPRESSION: 1. No acute osseous abnormality. 2. Moderate first CMC osteoarthritis. Electronically Signed   By: Harrietta Sherry M.D.   On: 12/28/2023 13:35   DG Tibia/Fibula Right Result Date: 12/28/2023 CLINICAL DATA:  Pain after fall. EXAM: RIGHT FOOT - 2 VIEW; RIGHT TIBIA AND FIBULA - 2 VIEW COMPARISON:  Right knee radiographs dated 04/28/2023. FINDINGS: Acute trimalleolar ankle fracture with lateral tibiotalar dislocation and valgus angulation. Transverse fracture of the medial malleolus with approximately 11 mm of lateral displacement. Oblique fracture of the distal fibular metaphysis with approximately 7-8 mm of lateral and mild posterior  displacement of the lateral malleolar fracture component. The lateral and medial malleolar fracture fragments appear to articulate with the laterally displaced talus. Posterior malleolar fracture with approximately 11 mm of superior displacement of the fracture fragment. Intact right total knee arthroplasty with appropriate alignment. IMPRESSION: Acute trimalleolar ankle fracture with lateral tibiotalar dislocation and valgus angulation. Electronically Signed   By: Harrietta Sherry M.D.   On: 12/28/2023 13:33   DG Foot 2 Views Right Result Date: 12/28/2023 CLINICAL DATA:  Pain after fall. EXAM: RIGHT FOOT - 2 VIEW; RIGHT TIBIA AND FIBULA - 2 VIEW COMPARISON:  Right knee radiographs dated 04/28/2023. FINDINGS: Acute trimalleolar ankle fracture with lateral tibiotalar dislocation and valgus angulation. Transverse fracture of the medial malleolus with approximately 11 mm of lateral displacement. Oblique fracture of the distal fibular metaphysis with approximately 7-8 mm of lateral and mild posterior displacement of the lateral malleolar fracture component. The lateral and medial malleolar fracture fragments appear to articulate with  the laterally displaced talus. Posterior malleolar fracture with approximately 11 mm of superior displacement of the fracture fragment. Intact right total knee arthroplasty with appropriate alignment. IMPRESSION: Acute trimalleolar ankle fracture with lateral tibiotalar dislocation and valgus angulation. Electronically Signed   By: Harrietta Sherry M.D.   On: 12/28/2023 13:33   DG Humerus Right Result Date: 12/28/2023 CLINICAL DATA:  Injury after fall. EXAM: RIGHT HUMERUS - 2+ VIEW COMPARISON:  None Available. FINDINGS: Acute comminuted fracture of the right proximal humerus with transverse fracture at/just below the level of the surgical neck with approximately 25 degrees of apex anterior angulation. Fracture margins extend through the greater tuberosity with mild lateral displacement. The glenohumeral joint is anatomically aligned. The acromioclavicular joint is anatomically aligned. IMPRESSION: Acute comminuted, angulated, and displaced fracture of the right proximal humerus involving the greater tuberosity and surgical neck. Electronically Signed   By: Harrietta Sherry M.D.   On: 12/28/2023 13:23    EKG: Orders placed or performed during the hospital encounter of 06/27/23   EKG 12-LEAD   EKG 12-LEAD   EKG 12-Lead   EKG 12-Lead     Hospital Course: Anita Shea is a 63 y.o. who was admitted to Hospital. They were brought to the operating room on 01/03/2024 and underwent Procedure(s): INSERTION, INTRAMEDULLARY ROD, HUMERUS, RIIGHT.  Patient tolerated the procedure well and was later transferred to the recovery room and then to the orthopaedic floor for postoperative care.  They were given PO and IV analgesics for pain control following their surgery.  They were given 24 hours of postoperative antibiotics of  Anti-infectives (From admission, onward)    Start     Dose/Rate Route Frequency Ordered Stop   01/03/24 2200  ceFAZolin  (ANCEF ) IVPB 2g/100 mL premix        2 g 200 mL/hr over 30 Minutes  Intravenous Every 6 hours 01/03/24 1831 01/05/24 1056   01/03/24 1530  ceFAZolin  (ANCEF ) IVPB 2g/100 mL premix        2 g 200 mL/hr over 30 Minutes Intravenous On call to O.R. 01/03/24 1512 01/03/24 1620   01/03/24 1513  ceFAZolin  (ANCEF ) 2-4 GM/100ML-% IVPB       Note to Pharmacy: Chauncey Rubin L: cabinet override      01/03/24 1513 01/03/24 1615      and started on DVT prophylaxis in the form of Lovenox .   PT and OT were ordered for assistance with ADLs and ambulation. discharge planning consulted to help with postop disposition and equipment needs.  Patient had an  uneventful night on the evening of surgery but that morning did have significant increased pain secondary to her block wearing off.  They started to work with therapy on day one. .  Continued to work with therapy into day two.  Dressing was changed on day two. the patient had progressed with therapy and meeting their goals. Patient was ready to go home.   Diet: Regular diet Activity:WBAT RUE. NWB RLE Follow-up:in 2 weeks Disposition - Home Discharged Condition: good   Discharge Instructions     Call MD / Call 911   Complete by: As directed    If you experience chest pain or shortness of breath, CALL 911 and be transported to the hospital emergency room.  If you develope a fever above 101 F, pus (white drainage) or increased drainage or redness at the wound, or calf pain, call your surgeon's office.   Constipation Prevention   Complete by: As directed    Drink plenty of fluids.  Prune juice may be helpful.  You may use a stool softener, such as Colace (over the counter) 100 mg twice a day.  Use MiraLax (over the counter) for constipation as needed.   Diet - low sodium heart healthy   Complete by: As directed    Increase activity slowly as tolerated   Complete by: As directed    Post-operative opioid taper instructions:   Complete by: As directed    POST-OPERATIVE OPIOID TAPER INSTRUCTIONS: It is important to wean off  of your opioid medication as soon as possible. If you do not need pain medication after your surgery it is ok to stop day one. Opioids include: Codeine, Hydrocodone (Norco, Vicodin), Oxycodone (Percocet, oxycontin ) and hydromorphone amongst others.  Long term and even short term use of opiods can cause: Increased pain response Dependence Constipation Depression Respiratory depression And more.  Withdrawal symptoms can include Flu like symptoms Nausea, vomiting And more Techniques to manage these symptoms Hydrate well Eat regular healthy meals Stay active Use relaxation techniques(deep breathing, meditating, yoga) Do Not substitute Alcohol to help with tapering If you have been on opioids for less than two weeks and do not have pain than it is ok to stop all together.  Plan to wean off of opioids This plan should start within one week post op of your joint replacement. Maintain the same interval or time between taking each dose and first decrease the dose.  Cut the total daily intake of opioids by one tablet each day Next start to increase the time between doses. The last dose that should be eliminated is the evening dose.         Allergies as of 01/05/2024       Reactions   Aspirin Anaphylaxis   Lisinopril Cough   Nsaids Hives   Crestor  [rosuvastatin ]    Patient had severe leg pain on several times with taking medication.  Even after a drug holiday and restarting medication she had leg pain with the medicine and not without        Medication List     TAKE these medications    acetaminophen  500 MG tablet Commonly known as: TYLENOL  Take 1,000 mg by mouth every 6 (six) hours as needed for mild pain (pain score 1-3) or moderate pain (pain score 4-6).   cetirizine 10 MG tablet Commonly known as: ZYRTEC Take 10 mg by mouth daily.   cholecalciferol 25 MCG (1000 UNIT) tablet Commonly known as: VITAMIN D3 Take 1,000 Units by mouth daily.   cyanocobalamin   1000 MCG  tablet Commonly known as: VITAMIN B12 Take 1,000 mcg by mouth daily.   cyclobenzaprine  10 MG tablet Commonly known as: FLEXERIL  TAKE 1 TABLET BY MOUTH EVERY 8 HOURS AS NEEDED FOR MUSCLE PAIN/SPASM   dicyclomine  20 MG tablet Commonly known as: BENTYL  TAKE 1 TABLET BY MOUTH THREE TIMES DAILY AS NEEDED FOR  ABDOMINAL  CRAMPING   docusate sodium  100 MG capsule Commonly known as: Colace Take 2 capsules (200 mg total) by mouth daily. What changed: when to take this   famotidine  10 MG tablet Commonly known as: PEPCID  Take 10-20 mg by mouth daily at 12 noon.   fluticasone 50 MCG/ACT nasal spray Commonly known as: FLONASE Place 1 spray into both nostrils daily.   hydrochlorothiazide  25 MG tablet Commonly known as: HYDRODIURIL  Take 1 tablet by mouth once daily   omeprazole  20 MG tablet Commonly known as: PRILOSEC  OTC Take 20-40 mg by mouth daily at 6 (six) AM.   ondansetron  4 MG disintegrating tablet Commonly known as: ZOFRAN -ODT Take 1 tablet (4 mg total) by mouth every 8 (eight) hours as needed. What changed: You were already taking a medication with the same name, and this prescription was added. Make sure you understand how and when to take each.   ondansetron  8 MG disintegrating tablet Commonly known as: ZOFRAN -ODT Take 1 tablet (8 mg total) by mouth every 8 (eight) hours as needed for nausea or vomiting. What changed: how much to take   oxyCODONE  5 MG immediate release tablet Commonly known as: Roxicodone  Take 1 tablet (5 mg total) by mouth every 4 (four) hours as needed for severe pain (pain score 7-10) or moderate pain (pain score 4-6). What changed: reasons to take this   potassium chloride  SA 20 MEQ tablet Commonly known as: KLOR-CON  M Take 1 tablet (20 mEq total) by mouth 2 (two) times daily.   pravastatin  20 MG tablet Commonly known as: PRAVACHOL  Take 1 tablet (20 mg total) by mouth daily.   simethicone  125 MG chewable tablet Commonly known as: MYLICON Chew  250 mg by mouth at bedtime.   Slow-Mag 71.5-119 MG Tbec SR tablet Generic drug: magnesium  chloride Take 1 tablet by mouth at bedtime.   tretinoin  0.025 % cream Commonly known as: RETIN-A  USE A PEA-SIZED AMOUNT TWICE A WEEK AT NIGHT FOR THE FIRST MONTH, THEN INCREASE TO THREE TIMES A WEEK AT NIGHT        Follow-up Information     Sharl Selinda Dover, MD Follow up in 2 week(s).   Specialty: Orthopedic Surgery Why: For wound re-check Contact information: [address] [place] [address] [phone]         Alphonsa Glendia LABOR, MD Follow up.   Specialty: Family Medicine Contact information: [address] [place] [address] [phone]         [facility] Follow up.   Why: Home health services will be provided by [facility], start of care within 48 hours post discharge Contact information: [address] [place] [address] [phone]                 Signed: Dayle Moores PA-C Orthopaedic Surgery 01/05/2024, 11:21 AM

## 2024-01-05 NOTE — Progress Notes (Signed)
 Physical Therapy Treatment Patient Details Name: Anita Shea MRN: [id number] DOB: [birth year] Today's Date: 01/05/2024   History of Present Illness Anita Shea is a 63 y.o. female admitted 01/03/24 for surgical repair of RUE. Pt had a ground-level fall 7/3, tripping over her dogs. Imaging demonstrated right proximal humerus fracture and right ankle trimalleolar fracture with some dislocation. Ankle was reduced in ED 7/3. Pt s/p right humerus IM nail 7/9. PMHx: HTN, diverticulitis, GERD. Of note, pt underwent Rt TKA (~7 weeks ago).    PT Comments  Pt greeted supine in bed, pleasant and agreeable to PT session. Reviewed RUE/RLE WBing status and RUE precautions. Pt engaged in transfer training to various surface. She performed a step pivot transfer using RW with close supervision for safety. Educated pt on sequencing techniques. Informed pt's husband how he should be positioned to support her during functional mobility. Advised pt and her husband to set up the environment so she is transferring to the left if possible as that is her stronger side currently.  Pt and her husband verbalized understanding of all education and will benefit from reinforcement. They feels ready and safe for discharge home with HHPT. I have answered all their questions related to mobility.       If plan is discharge home, recommend the following: A little help with walking and/or transfers;A little help with bathing/dressing/bathroom;Assistance with cooking/housework;Assist for transportation;Help with stairs or ramp for entrance   Can travel by private vehicle        Equipment Recommendations  None recommended by PT (Pt already has DME)    Recommendations for Other Services       Precautions / Restrictions Precautions Precautions: Fall Recall of Precautions/Restrictions: Intact Precaution/Restrictions Comments: R shoulder AROM flexion to shoulder; R shoulder PROM flexion above shoulder Required Braces or  Orthoses: Splint/Cast Splint/Cast: RLE Splint/Cast - Date Prophylactic Dressing Applied (if applicable): 12/28/23 Restrictions Weight Bearing Restrictions Per Provider Order: Yes RUE Weight Bearing Per Provider Order: Weight bearing as tolerated RLE Weight Bearing Per Provider Order: Non weight bearing     Mobility  Bed Mobility Overal bed mobility: Needs Assistance Bed Mobility: Supine to Sit     Supine to sit: Supervision, HOB elevated     General bed mobility comments: Pt sat up on L side of bed with HOB elevated to ~30deg. No physical assist.    Transfers Overall transfer level: Needs assistance Equipment used: Rolling walker (2 wheels) Transfers: Sit to/from Stand, Bed to chair/wheelchair/BSC Sit to Stand: Supervision Stand pivot transfers: Supervision Step pivot transfers: Supervision       General transfer comment: Pt demonstrated proper hand placement using RW. Powered up without physical assist. Close supervision for safety. Pt transferred to manual w/c followed by recliner chair on her left by pivoting on L foot. Advised pt to set-up the envrionment so she would be going to the left during transfers since this is her stronger side currently. Cued weight shift into the ball of her Lt foot to move heel and then weight shift into the heel of Lt foot to move the toes as well as how to sequencing RW during transition. Good eccentric control with sitting.    Ambulation/Gait               General Gait Details: Unable - Pt reports inability to increase WBing through BUE support on RW to advance LLE while maintaining RLE NWB.   Stairs  Wheelchair Mobility     Tilt Bed    Modified Rankin (Stroke Patients Only)       Balance Overall balance assessment: Needs assistance Sitting-balance support: Single extremity supported, Feet supported Sitting balance-Leahy Scale: Fair Sitting balance - Comments: Pt sat on various surfaces with supervision.  She was able to scoot fwd/bkwd with unilateral UE support.   Standing balance support: Bilateral upper extremity supported, Single extremity supported, During functional activity, Reliant on assistive device for balance Standing balance-Leahy Scale: Poor Standing balance comment: Pt dependent on RW for transfers. She demonstrated ability to reduced to unilat UE support on RW to address clothing.                            Communication Communication Communication: No apparent difficulties  Cognition Arousal: Alert Behavior During Therapy: WFL for tasks assessed/performed   PT - Cognitive impairments: No apparent impairments                         Following commands: Intact      Cueing Cueing Techniques: Verbal cues, Visual cues  Exercises      General Comments General comments (skin integrity, edema, etc.): Answered pt's questions regarding mobilizing within her home enviroment. Educated pt/family on the best way to support the pt during mobility and safety considerations.      Pertinent Vitals/Pain Pain Assessment Pain Assessment: Faces Faces Pain Scale: Hurts little more Pain Location: R shoulder Pain Descriptors / Indicators: Discomfort, Aching, Sore Pain Intervention(s): Monitored during session, Limited activity within patient's tolerance, Repositioned    Home Living                          Prior Function            PT Goals (current goals can now be found in the care plan section) Acute Rehab PT Goals Patient Stated Goal: Return Home, required less assistance from my family, and move safely Progress towards PT goals: Progressing toward goals    Frequency    Min 2X/week      PT Plan      Co-evaluation              AM-PAC PT 6 Clicks Mobility   Outcome Measure  Help needed turning from your back to your side while in a flat bed without using bedrails?: A Little Help needed moving from lying on your back to  sitting on the side of a flat bed without using bedrails?: A Little Help needed moving to and from a bed to a chair (including a wheelchair)?: A Little Help needed standing up from a chair using your arms (e.g., wheelchair or bedside chair)?: A Little Help needed to walk in hospital room?: Total Help needed climbing 3-5 steps with a railing? : Total 6 Click Score: 14    End of Session Equipment Utilized During Treatment: Gait belt Activity Tolerance: Patient tolerated treatment well Patient left: in chair;with call bell/phone within reach;with chair alarm set;with family/visitor present Nurse Communication: Mobility status PT Visit Diagnosis: Pain;Difficulty in walking, not elsewhere classified (R26.2);Unsteadiness on feet (R26.81);Other abnormalities of gait and mobility (R26.89) Pain - Right/Left: Right Pain - part of body: Shoulder     Time: 8853-8790 PT Time Calculation (min) (ACUTE ONLY): 23 min  Charges:    $Therapeutic Activity: 23-37 mins PT General Charges $$ ACUTE PT VISIT: 1 Visit  Randall SAUNDERS, PT, DPT Acute Rehabilitation Services Office: [phone] Secure Chat Preferred  Delon CHRISTELLA Callander 01/05/2024, 1:22 PM

## 2024-01-10 ENCOUNTER — Encounter: Payer: Self-pay | Admitting: Family Medicine

## 2024-02-29 ENCOUNTER — Ambulatory Visit: Payer: 59 | Admitting: Dermatology

## 2024-03-04 ENCOUNTER — Encounter: Payer: Self-pay | Admitting: Family Medicine

## 2024-03-04 ENCOUNTER — Encounter (INDEPENDENT_AMBULATORY_CARE_PROVIDER_SITE_OTHER): Payer: Self-pay | Admitting: Gastroenterology

## 2024-03-05 ENCOUNTER — Other Ambulatory Visit: Payer: Self-pay

## 2024-03-05 ENCOUNTER — Other Ambulatory Visit: Payer: Self-pay | Admitting: Family Medicine

## 2024-03-05 ENCOUNTER — Encounter: Payer: Self-pay | Admitting: Family Medicine

## 2024-03-05 DIAGNOSIS — E7849 Other hyperlipidemia: Secondary | ICD-10-CM

## 2024-03-05 DIAGNOSIS — I10 Essential (primary) hypertension: Secondary | ICD-10-CM

## 2024-03-05 DIAGNOSIS — E876 Hypokalemia: Secondary | ICD-10-CM

## 2024-03-05 DIAGNOSIS — Z79899 Other long term (current) drug therapy: Secondary | ICD-10-CM

## 2024-03-05 MED ORDER — HYDROCHLOROTHIAZIDE 25 MG PO TABS: 25.0000 mg | ORAL_TABLET | Freq: Every day | ORAL | 0 refills | Status: DC

## 2024-03-05 MED ORDER — POTASSIUM CHLORIDE CRYS ER 20 MEQ PO TBCR: 20.0000 meq | EXTENDED_RELEASE_TABLET | Freq: Two times a day (BID) | ORAL | 6 refills | Status: DC

## 2024-03-05 NOTE — Telephone Encounter (Signed)
 Nurses I read through her note  I sent in refills of medicine   Please order lipid, liver, metabolic 7, magnesium  Patient to do this lab work in approximately 10 to 12 weeks Diagnosis hyperlipidemia,HRM, HTN  You may also share message with patient  Hi Anita Shea This has certainly been a long road for you.  Glad you are holding up.  Certainly help the best that you will get to the point where you are able to do your walking and exercises without a walker/cane/boot  As for the medication yes you would want to take the potassium with the diuretic Please do lab work in 10 to 12 weeks It would be fine to restart the statin if you do not tolerate it simply send me a message   Would not be a bad idea to do a follow-up visit in approximately 4 months / 5 months-I would recommend that you work with the front to help set this up  If you need anything feel free to reach out [facility] Wyss

## 2024-03-06 ENCOUNTER — Other Ambulatory Visit: Payer: Self-pay | Admitting: Family Medicine

## 2024-03-06 DIAGNOSIS — I10 Essential (primary) hypertension: Secondary | ICD-10-CM

## 2024-03-06 MED ORDER — HYDROCHLOROTHIAZIDE 25 MG PO TABS: 25.0000 mg | ORAL_TABLET | Freq: Every day | ORAL | 1 refills | Status: AC

## 2024-03-06 NOTE — Telephone Encounter (Signed)
 Nurses Just previously we have put in orders for the patient's lab work She has requested additional lab work Please order vitamin D  for vitamin D  deficiency Order vitamin B12 for vitamin B12 deficiency All the other lab work is already in the system The patient will need to specifically request that when the blood work is drawn that they draw the panel of blood work we just ordered a couple days ago and the test we just ordered today because if she just goes there and gets labs drawn without requesting both of them being drawn they will only do the most recent test   Unfortunately it adds complexity to add test when they are already other tests that were just put in but if she specifically request both dates to be drawn they should do that  As for the HCTZ I did already send that medicine then I resent it again today  As for the lab work she can do that before her office visit  I appreciate your help-please keep patient informed regarding the above thank you-Dr. Glendia

## 2024-03-07 ENCOUNTER — Other Ambulatory Visit: Payer: Self-pay

## 2024-03-07 DIAGNOSIS — E538 Deficiency of other specified B group vitamins: Secondary | ICD-10-CM

## 2024-03-07 DIAGNOSIS — E559 Vitamin D deficiency, unspecified: Secondary | ICD-10-CM

## 2024-04-10 ENCOUNTER — Encounter (INDEPENDENT_AMBULATORY_CARE_PROVIDER_SITE_OTHER): Payer: Self-pay | Admitting: Gastroenterology

## 2024-04-17 ENCOUNTER — Encounter: Payer: Self-pay | Admitting: Family Medicine

## 2024-04-17 NOTE — Telephone Encounter (Signed)
 Nurses To her urine I would recommend a urine ACR to check for protein given her history of hypertension (On a side note if she feels she is have any UTIs we can always check a urine when she comes to the office but for now lets do urine ACR)  Also because of potential iron deficiency anemia please check TIBC ferritin  Please add this to the list of labs that she has ordered and from September she will do these labs before that visit thank you-feel free to send her a reminder thank you-Dr. Glendia

## 2024-04-20 LAB — MAGNESIUM: Magnesium: 2.1 mg/dL (ref 1.6–2.3)

## 2024-04-20 LAB — LIPID PANEL
Chol/HDL Ratio: 3.8 ratio (ref 0.0–4.4)
Cholesterol, Total: 181 mg/dL (ref 100–199)
HDL: 48 mg/dL (ref >39)
LDL Chol Calc (NIH): 106 mg/dL — ABNORMAL HIGH (ref 0–99)
Triglycerides: 152 mg/dL — ABNORMAL HIGH (ref 0–149)
VLDL Cholesterol Cal: 27 mg/dL (ref 5–40)

## 2024-04-20 LAB — HEPATIC FUNCTION PANEL
ALT: 25 IU/L (ref 0–32)
AST: 22 IU/L (ref 0–40)
Albumin: 4.5 g/dL (ref 3.9–4.9)
Alkaline Phosphatase: 151 IU/L — ABNORMAL HIGH (ref 49–135)
Bilirubin Total: 0.4 mg/dL (ref 0.0–1.2)
Bilirubin, Direct: 0.14 mg/dL (ref 0.00–0.40)
Total Protein: 7.8 g/dL (ref 6.0–8.5)

## 2024-04-20 LAB — BASIC METABOLIC PANEL WITH GFR
BUN/Creatinine Ratio: 12 (ref 12–28)
BUN: 8 mg/dL (ref 8–27)
CO2: 25 mmol/L (ref 20–29)
Calcium: 9.5 mg/dL (ref 8.7–10.3)
Chloride: 100 mmol/L (ref 96–106)
Creatinine, Ser: 0.66 mg/dL (ref 0.57–1.00)
Glucose: 93 mg/dL (ref 70–99)
Potassium: 4.1 mmol/L (ref 3.5–5.2)
Sodium: 140 mmol/L (ref 134–144)
eGFR: 99 mL/min/1.73 (ref >59)

## 2024-04-20 LAB — VITAMIN B12: Vitamin B-12: 1538 pg/mL — ABNORMAL HIGH (ref 232–1245)

## 2024-04-20 LAB — VITAMIN D 25 HYDROXY (VIT D DEFICIENCY, FRACTURES): Vit D, 25-Hydroxy: 44.5 ng/mL (ref 30.0–100.0)

## 2024-04-21 ENCOUNTER — Ambulatory Visit: Payer: Self-pay | Admitting: Family Medicine

## 2024-04-25 ENCOUNTER — Encounter: Payer: Self-pay | Admitting: Family Medicine

## 2024-04-25 ENCOUNTER — Ambulatory Visit (INDEPENDENT_AMBULATORY_CARE_PROVIDER_SITE_OTHER): Admitting: Family Medicine

## 2024-04-25 VITALS — BP 134/82 | HR 88 | Ht 67.0 in | Wt 180.0 lb

## 2024-04-25 DIAGNOSIS — I10 Essential (primary) hypertension: Secondary | ICD-10-CM | POA: Diagnosis not present

## 2024-04-25 DIAGNOSIS — E7849 Other hyperlipidemia: Secondary | ICD-10-CM | POA: Diagnosis not present

## 2024-04-25 DIAGNOSIS — Z79899 Other long term (current) drug therapy: Secondary | ICD-10-CM

## 2024-04-25 DIAGNOSIS — R748 Abnormal levels of other serum enzymes: Secondary | ICD-10-CM | POA: Diagnosis not present

## 2024-04-25 DIAGNOSIS — R5383 Other fatigue: Secondary | ICD-10-CM | POA: Diagnosis not present

## 2024-04-25 MED ORDER — POTASSIUM CHLORIDE CRYS ER 10 MEQ PO TBCR: EXTENDED_RELEASE_TABLET | ORAL | 2 refills | Status: DC

## 2024-04-25 NOTE — Progress Notes (Signed)
 Subjective:    Patient ID: Anita Shea, female    DOB: Apr 06, 1961, 63 y.o.   MRN: [id number]  HPI  6 month follow up - discuss potassium pill - has difficulty with it and too chalky Multiple surgeries since last visit Discussed the use of AI scribe software for clinical note transcription with the patient, who gave verbal consent to proceed.  History of Present Illness   Anita Shea is a 63 year old female who presents for follow-up on her recovery and physical therapy progress.  She has been recovering from a knee issue that began a year ago, leading to a meniscus tear. A corticosteroid injection received on November 2nd last year did not alleviate her symptoms. She also had fall with fractures and surgery. Over the past year, she has experienced significant muscle fatigue and has not engaged in much physical activity.  Recently, she transitioned from a cast to a walking boot and is now able to bear weight fully. She attends physical therapy twice a week and uses a recumbent bike at home, riding for five minutes at a time. She experiences fatigue after five to six minutes of biking and does not experience shortness of breath. Her goal is to increase her biking time and intensity.  She is motivated to return to her previous activity level, which included walking two miles a day. She is cautious about using a treadmill due to the risk of stumbling and prefers walking on level ground with a walking stick for stability.  Her nutrition has shifted from a strict vegetarian diet to occasionally including chicken and other meats, though she remains primarily vegetarian. She eats Greek yogurt for dinner to avoid heartburn, which occurs if she eats a larger meal in the evening.  She takes potassium supplements twice daily, along with vitamin D , magnesium , and a proton pump inhibitor for heartburn. She experiences heartburn that requires two doses of her medication at bedtime, especially after  eating foods like pizza.  Her sleep pattern includes going to bed early and waking up early, with occasional awakenings during the night, but she generally returns to sleep easily. Her blood pressure is okay when checked at home, though not frequently.  Family history includes her father who died of a heart condition and a brother who had a heart transplant following a massive heart attack at age 35.      Review of Systems     Objective:   Physical Exam General-in no acute distress Eyes-no discharge Lungs-respiratory rate normal, CTA CV-no murmurs,RRR Extremities skin warm dry no edema Neuro grossly normal Behavior normal, alert   Muscle fatigue and deconditioning after orthopedic injury Muscle fatigue and deconditioning post-meniscus tear recovery. Fatigue due to lack of endurance. - Continue physical therapy twice weekly. - Perform home exercises, including recumbent bike, increasing time and intensity gradually. - Use hiking poles for stability during walking. - Avoid intense exercise on consecutive days. - Join [employer] for additional exercise options.  Gastroesophageal reflux disease with heartburn Chronic GERD managed with omeprazole  40 mg. Omeprazole  effective but long-term PPI use has risks. H2 blockers are a safer alternative but less effective. - Continue omeprazole  40 mg. - Consider H2 blockers if omeprazole  efficacy decreases or concerns about long-term PPI use arise.  Hyperlipidemia LDL at 106 mg/dL, goal <29 mg/dL. On statin since March 07, 2024. Discussed familial hyperlipidemia and need for dose adjustment. - Continue statin therapy. - Monitor LDL and adjust statin dosage to achieve target LDL <70  mg/dL. - Consider referral to hyperlipidemic clinic if statin intolerance occurs.  Vegetarian diet with occasional meat intake Primarily vegetarian diet with occasional meat. Discussed balanced diet and social meat consumption. - Continue primarily vegetarian  diet with occasional meat intake. - Monitor dietary habits for nutritional balance.  General Health Maintenance Blood pressure well-managed, adequate B12 and vitamin D  levels. Potassium supplementation appropriate. Discussed cholesterol management and familial hyperlipidemia. - Change potassium supplementation to two 10 mg tablets morning and evening. - Reduce vitamin B12 intake to four days a week or every other day. - Re-evaluate cholesterol levels in six months. - Monitor alkaline phosphatase levels in four to six months.      Assessment & Plan:  1. Essential hypertension, benign (Primary) We will change her potassium is difficult for her to swallow the current Continue current blood pressure medicine follow-up within 6 months  2. Other hyperlipidemia Lab work by spring time healthy diet regular activity, currently taking statin, previously not able to tolerate really well Hopefully we can get the LDL below 70  3. Other fatigue Patient has setbacks due to her surgeries and injuries but gradually should be able to get back a fair amount of her muscle stability hopefully her orthopedics will recover as well

## 2024-04-29 ENCOUNTER — Encounter: Payer: Self-pay | Admitting: Radiology

## 2024-05-03 ENCOUNTER — Telehealth: Payer: Self-pay

## 2024-05-03 NOTE — Telephone Encounter (Signed)
 Copied from CRM #[id number]. Topic: Clinical - Lab/Test Results >> May 03, 2024  2:27 PM Emylou G wrote: Reason for CRM: Patient called.. needs clarity if she needs to do labs?  She got a message from [facility]?  I do see them ordered.  But she wasn't sure if she needs to make an appt or not?

## 2024-05-07 ENCOUNTER — Other Ambulatory Visit: Payer: Self-pay | Admitting: Family Medicine

## 2024-05-07 ENCOUNTER — Encounter: Payer: Self-pay | Admitting: Family Medicine

## 2024-05-07 DIAGNOSIS — I10 Essential (primary) hypertension: Secondary | ICD-10-CM

## 2024-05-07 DIAGNOSIS — R8290 Unspecified abnormal findings in urine: Secondary | ICD-10-CM

## 2024-05-07 NOTE — Telephone Encounter (Signed)
I will send patient a MyChart message thanks

## 2024-05-12 ENCOUNTER — Encounter: Payer: Self-pay | Admitting: Family Medicine

## 2024-05-13 ENCOUNTER — Other Ambulatory Visit: Payer: Self-pay

## 2024-05-13 ENCOUNTER — Ambulatory Visit: Admitting: Family Medicine

## 2024-05-13 ENCOUNTER — Encounter: Payer: Self-pay | Admitting: Family Medicine

## 2024-05-13 VITALS — BP 122/88 | HR 83 | Temp 98.1°F | Ht 67.0 in | Wt 178.0 lb

## 2024-05-13 DIAGNOSIS — K5792 Diverticulitis of intestine, part unspecified, without perforation or abscess without bleeding: Secondary | ICD-10-CM

## 2024-05-13 DIAGNOSIS — R1030 Lower abdominal pain, unspecified: Secondary | ICD-10-CM

## 2024-05-13 DIAGNOSIS — I10 Essential (primary) hypertension: Secondary | ICD-10-CM

## 2024-05-13 DIAGNOSIS — R8290 Unspecified abnormal findings in urine: Secondary | ICD-10-CM

## 2024-05-13 MED ORDER — AMOXICILLIN-POT CLAVULANATE 875-125 MG PO TABS: 1.0000 | ORAL_TABLET | Freq: Two times a day (BID) | ORAL | 0 refills | Status: DC

## 2024-05-13 MED ORDER — ONDANSETRON 8 MG PO TBDP: 8.0000 mg | ORAL_TABLET | Freq: Three times a day (TID) | ORAL | 0 refills | Status: AC | PRN

## 2024-05-13 MED ORDER — DICYCLOMINE HCL 20 MG PO TABS: ORAL_TABLET | ORAL | 1 refills | Status: DC

## 2024-05-13 NOTE — Progress Notes (Signed)
   Subjective:    Patient ID: Anita Shea, female    DOB: April 21, 1961, 63 y.o.   MRN: [id number] Pt here with lower abdominal pain. She is concerned about a uti vs diverticulitis.  HPI  Patient with lower abdominal pain discomfort present x 48 hours starting possibly late Saturday night definitely Sunday progressive through Sunday intermittent cramping with it.  No vomiting diarrhea or bloody stools.  Patient gave a urine specimen today did not show UTI Has not had any severe pain during the night She is a vegetarian and eats very healthy Has a history of diverticula and a history of diverticulitis  Review of Systems     Objective:   Physical Exam General-in no acute distress Eyes-no discharge Lungs-respiratory rate normal, CTA CV-no murmurs,RRR Extremities skin warm dry no edema Neuro grossly normal Behavior normal, alert Abdomen is soft with mild abdominal tenderness in the mid area and right and left lower quadrant no guarding or rebound       Assessment & Plan:  Probable diverticulitis The likelihood of this being something else is low We did discuss various approaches that could be utilized to try to help with this including lab work and scans It would be reasonable based on her history of her colonoscopy and previous diverticulitis to treat this with antibiotics and if she does not show improvement over the next 48 hours then progress forward with lab work and CT scan If she has progressive setbacks immediately patient to let us  know and we would move forward with doing lab work and CAT scan she will give us  an update in 48 hours Augmentin  875 twice daily for 10 days and Zofran  as needed for nausea

## 2024-05-15 ENCOUNTER — Encounter: Payer: Self-pay | Admitting: Family Medicine

## 2024-05-15 ENCOUNTER — Ambulatory Visit (INDEPENDENT_AMBULATORY_CARE_PROVIDER_SITE_OTHER): Admitting: Family Medicine

## 2024-05-15 ENCOUNTER — Other Ambulatory Visit (HOSPITAL_COMMUNITY)
Admission: RE | Admit: 2024-05-15 | Discharge: 2024-05-15 | Disposition: A | Discharge status: Home / Self Care | Source: Ambulatory Visit | Attending: Family Medicine | Admitting: Family Medicine

## 2024-05-15 ENCOUNTER — Ambulatory Visit: Payer: Self-pay | Admitting: Family Medicine

## 2024-05-15 VITALS — BP 136/85 | HR 91 | Temp 98.0°F | Ht 67.0 in

## 2024-05-15 DIAGNOSIS — R1030 Lower abdominal pain, unspecified: Secondary | ICD-10-CM

## 2024-05-15 DIAGNOSIS — K5792 Diverticulitis of intestine, part unspecified, without perforation or abscess without bleeding: Secondary | ICD-10-CM

## 2024-05-15 LAB — CBC WITH DIFFERENTIAL/PLATELET
Abs Immature Granulocytes: 0.03 K/uL (ref 0.00–0.07)
Basophils Absolute: 0.1 K/uL (ref 0.0–0.1)
Basophils Relative: 1 %
Eosinophils Absolute: 0.2 K/uL (ref 0.0–0.5)
Eosinophils Relative: 2 %
HCT: 39.5 % (ref 36.0–46.0)
Hemoglobin: 12.9 g/dL (ref 12.0–15.0)
Immature Granulocytes: 0 %
Lymphocytes Relative: 28 %
Lymphs Abs: 3.1 K/uL (ref 0.7–4.0)
MCH: 28.2 pg (ref 26.0–34.0)
MCHC: 32.7 g/dL (ref 30.0–36.0)
MCV: 86.4 fL (ref 80.0–100.0)
Monocytes Absolute: 0.9 K/uL (ref 0.1–1.0)
Monocytes Relative: 8 %
Neutro Abs: 6.9 K/uL (ref 1.7–7.7)
Neutrophils Relative %: 61 %
Platelets: 409 K/uL — ABNORMAL HIGH (ref 150–400)
RBC: 4.57 MIL/uL (ref 3.87–5.11)
RDW: 13.5 % (ref 11.5–15.5)
WBC: 11.1 K/uL — ABNORMAL HIGH (ref 4.0–10.5)
nRBC: 0 % (ref 0.0–0.2)

## 2024-05-15 LAB — HEPATIC FUNCTION PANEL
ALT: 19 U/L (ref 0–44)
AST: 20 U/L (ref 15–41)
Albumin: 4.4 g/dL (ref 3.5–5.0)
Alkaline Phosphatase: 133 U/L — ABNORMAL HIGH (ref 38–126)
Bilirubin, Direct: 0.2 mg/dL (ref 0.0–0.2)
Indirect Bilirubin: 0.2 mg/dL — ABNORMAL LOW (ref 0.3–0.9)
Total Bilirubin: 0.4 mg/dL (ref 0.0–1.2)
Total Protein: 8.7 g/dL — ABNORMAL HIGH (ref 6.5–8.1)

## 2024-05-15 LAB — BASIC METABOLIC PANEL WITH GFR
Anion gap: 14 (ref 5–15)
BUN: 8 mg/dL (ref 8–23)
CO2: 28 mmol/L (ref 22–32)
Calcium: 9.7 mg/dL (ref 8.9–10.3)
Chloride: 96 mmol/L — ABNORMAL LOW (ref 98–111)
Creatinine, Ser: 0.72 mg/dL (ref 0.44–1.00)
GFR, Estimated: >60 mL/min (ref >60)
Glucose, Bld: 101 mg/dL — ABNORMAL HIGH (ref 70–99)
Potassium: 3.3 mmol/L — ABNORMAL LOW (ref 3.5–5.1)
Sodium: 137 mmol/L (ref 135–145)

## 2024-05-15 LAB — MICROALBUMIN / CREATININE URINE RATIO
Creatinine, Urine: 90.6 mg/dL
Microalb/Creat Ratio: 24 mg/g{creat} (ref 0–29)
Microalbumin, Urine: 21.7 ug/mL

## 2024-05-15 LAB — LIPASE, BLOOD: Lipase: 19 U/L (ref 11–51)

## 2024-05-15 LAB — URINE CULTURE

## 2024-05-15 MED ORDER — POTASSIUM CHLORIDE CRYS ER 10 MEQ PO TBCR: EXTENDED_RELEASE_TABLET | ORAL | 2 refills | Status: DC

## 2024-05-15 NOTE — Progress Notes (Signed)
   Subjective:    Patient ID: Anita Shea, female    DOB: 27-Aug-1960, 63 y.o.   MRN: [id number] Worsening abdominal pain. HPI Patient history of diverticulitis See documentation from 2 days ago Treated with Augmentin  See discussion below Increased pain and discomfort Need to rule out the possibility of abscess   Review of Systems     Objective:   Physical Exam  General-in no acute distress Eyes-no discharge Lungs-respiratory rate normal, CTA CV-no murmurs,RRR Extremities skin warm dry no edema Neuro grossly normal Behavior normal, alert Lower abd pain and tenderness      Assessment & Plan:  1. Diverticulitis (Primary) Patient has history of diverticulitis She was seen 2 days ago and was treated with Augmentin  In the past is not tolerated Cipro  Flagyl  She had significant tenderness 2 days ago we decided to treat presumptively with monitoring 48 hours is past and her pain is worse increased pain and discomfort as well as significant tenderness in the lower abdomen consistent with diverticulitis with need to make sure that we are not dealing with a diverticular abscess therefore the patient does need stat lab work as well as a stat scan She cannot tolerate oral contrast but we will do IV contrast please see discussion above Will follow-up with patient after the tests  2. Lower abdominal pain See above

## 2024-05-16 ENCOUNTER — Ambulatory Visit (HOSPITAL_COMMUNITY)
Admission: RE | Admit: 2024-05-16 | Discharge: 2024-05-16 | Disposition: A | Discharge status: Home / Self Care | Source: Ambulatory Visit | Attending: Family Medicine | Admitting: Family Medicine

## 2024-05-16 ENCOUNTER — Ambulatory Visit: Payer: Self-pay | Admitting: Family Medicine

## 2024-05-16 ENCOUNTER — Other Ambulatory Visit (HOSPITAL_COMMUNITY): Payer: Self-pay | Admitting: Family Medicine

## 2024-05-16 DIAGNOSIS — R1030 Lower abdominal pain, unspecified: Secondary | ICD-10-CM | POA: Insufficient documentation

## 2024-05-16 DIAGNOSIS — Z1231 Encounter for screening mammogram for malignant neoplasm of breast: Secondary | ICD-10-CM

## 2024-05-16 LAB — URINALYSIS
Bilirubin, UA: NEGATIVE
Glucose, UA: NEGATIVE
Ketones, UA: NEGATIVE
Leukocytes,UA: NEGATIVE
Nitrite, UA: NEGATIVE
Protein,UA: NEGATIVE
RBC, UA: NEGATIVE
Specific Gravity, UA: 1.025 (ref 1.005–1.030)
Urobilinogen, Ur: 0.2 mg/dL (ref 0.2–1.0)
pH, UA: 5.5 (ref 5.0–7.5)

## 2024-05-16 MED ORDER — IOHEXOL 300 MG/ML  SOLN
100.0000 mL | Freq: Once | INTRAMUSCULAR | Status: AC | PRN
  Administered 2024-05-16: 100 mL via INTRAVENOUS

## 2024-05-18 ENCOUNTER — Ambulatory Visit: Payer: Self-pay | Admitting: Family Medicine

## 2024-05-29 ENCOUNTER — Ambulatory Visit (HOSPITAL_COMMUNITY)

## 2024-05-31 ENCOUNTER — Ambulatory Visit (HOSPITAL_COMMUNITY)

## 2024-06-10 ENCOUNTER — Ambulatory Visit: Admitting: Family Medicine

## 2024-06-10 ENCOUNTER — Other Ambulatory Visit (HOSPITAL_COMMUNITY)
Admission: RE | Admit: 2024-06-10 | Discharge: 2024-06-10 | Disposition: A | Source: Ambulatory Visit | Attending: Family Medicine | Admitting: Family Medicine

## 2024-06-10 ENCOUNTER — Ambulatory Visit: Payer: Self-pay | Admitting: Family Medicine

## 2024-06-10 ENCOUNTER — Other Ambulatory Visit: Payer: Self-pay | Admitting: Family Medicine

## 2024-06-10 VITALS — BP 131/86 | Ht 67.0 in | Wt 178.0 lb

## 2024-06-10 DIAGNOSIS — K5792 Diverticulitis of intestine, part unspecified, without perforation or abscess without bleeding: Secondary | ICD-10-CM

## 2024-06-10 DIAGNOSIS — R109 Unspecified abdominal pain: Secondary | ICD-10-CM | POA: Diagnosis present

## 2024-06-10 LAB — CBC WITH DIFFERENTIAL/PLATELET
Abs Immature Granulocytes: 0.02 K/uL (ref 0.00–0.07)
Basophils Absolute: 0 K/uL (ref 0.0–0.1)
Basophils Relative: 0 %
Eosinophils Absolute: 0.2 K/uL (ref 0.0–0.5)
Eosinophils Relative: 2 %
HCT: 39.2 % (ref 36.0–46.0)
Hemoglobin: 12.8 g/dL (ref 12.0–15.0)
Immature Granulocytes: 0 %
Lymphocytes Relative: 28 %
Lymphs Abs: 2.7 K/uL (ref 0.7–4.0)
MCH: 28.3 pg (ref 26.0–34.0)
MCHC: 32.7 g/dL (ref 30.0–36.0)
MCV: 86.7 fL (ref 80.0–100.0)
Monocytes Absolute: 0.8 K/uL (ref 0.1–1.0)
Monocytes Relative: 8 %
Neutro Abs: 5.9 K/uL (ref 1.7–7.7)
Neutrophils Relative %: 62 %
Platelets: 365 K/uL (ref 150–400)
RBC: 4.52 MIL/uL (ref 3.87–5.11)
RDW: 14.2 % (ref 11.5–15.5)
WBC: 9.6 K/uL (ref 4.0–10.5)
nRBC: 0 % (ref 0.0–0.2)

## 2024-06-10 LAB — CBC
HCT: 38.7 % (ref 36.0–46.0)
Hemoglobin: 12.7 g/dL (ref 12.0–15.0)
MCH: 28.2 pg (ref 26.0–34.0)
MCHC: 32.8 g/dL (ref 30.0–36.0)
MCV: 85.8 fL (ref 80.0–100.0)
Platelets: 356 K/uL (ref 150–400)
RBC: 4.51 MIL/uL (ref 3.87–5.11)
RDW: 13.8 % (ref 11.5–15.5)
WBC: 9.8 K/uL (ref 4.0–10.5)
nRBC: 0 % (ref 0.0–0.2)

## 2024-06-10 LAB — COMPREHENSIVE METABOLIC PANEL WITH GFR
ALT: 20 U/L (ref 0–44)
AST: 20 U/L (ref 15–41)
Albumin: 4.6 g/dL (ref 3.5–5.0)
Alkaline Phosphatase: 120 U/L (ref 38–126)
Anion gap: 15 (ref 5–15)
BUN: 8 mg/dL (ref 8–23)
CO2: 22 mmol/L (ref 22–32)
Calcium: 9.4 mg/dL (ref 8.9–10.3)
Chloride: 101 mmol/L (ref 98–111)
Creatinine, Ser: 0.63 mg/dL (ref 0.44–1.00)
GFR, Estimated: >60 mL/min (ref >60)
Glucose, Bld: 99 mg/dL (ref 70–99)
Potassium: 3.8 mmol/L (ref 3.5–5.1)
Sodium: 138 mmol/L (ref 135–145)
Total Bilirubin: 0.4 mg/dL (ref 0.0–1.2)
Total Protein: 8.3 g/dL — ABNORMAL HIGH (ref 6.5–8.1)

## 2024-06-10 LAB — LIPASE, BLOOD: Lipase: 22 U/L (ref 11–51)

## 2024-06-10 MED ORDER — AMOXICILLIN-POT CLAVULANATE 875-125 MG PO TABS: 1.0000 | ORAL_TABLET | Freq: Two times a day (BID) | ORAL | 0 refills | Status: AC

## 2024-06-10 MED ORDER — DICYCLOMINE HCL 20 MG PO TABS: ORAL_TABLET | ORAL | 2 refills | Status: AC

## 2024-06-10 NOTE — Progress Notes (Signed)
° °  Subjective:    Patient ID: Anita Shea, female    DOB: [birth year], 63 y.o.   MRN: [id number]  HPI Patient with onset Thursday night into Friday morning ongoing lower abdominal pain discomfort worse on the mid pelvic region to the left side but radiates across the lower abdomen.  Denies high fever chills or sweats.  Denies nausea or vomiting.  Energy level overall could be better.  Patient has a long history of diverticulitis with reoccurrence.  Recent CAT scan in November showed diverticulitis but no growths patient had colonoscopy within the past 12 months   Review of Systems     Objective:   Physical Exam General-in no acute distress Eyes-no discharge Lungs-respiratory rate normal, CTA CV-no murmurs,RRR Extremities skin warm dry no edema Neuro grossly normal Behavior normal, alert Lower abdominal tenderness more in the mid abdomen but a little bit to the left and right no guarding or rebound  Lab work reviewed  No red flags currently     Assessment & Plan:  More than likely acute diverticulitis Augmentin  twice daily 10 days If not dramatic improvement over the next 72 hours to notify us  Should gradually get better over the next week Hold off on CAT scan Patient does not need any surgical intervention

## 2024-06-11 ENCOUNTER — Ambulatory Visit: Admitting: Family Medicine

## 2024-06-13 ENCOUNTER — Other Ambulatory Visit: Payer: Self-pay | Admitting: Family Medicine

## 2024-07-04 ENCOUNTER — Ambulatory Visit: Admitting: Dermatology

## 2024-07-11 ENCOUNTER — Encounter: Payer: Self-pay | Admitting: Family Medicine

## 2024-07-12 ENCOUNTER — Other Ambulatory Visit: Payer: Self-pay | Admitting: Family Medicine

## 2024-07-12 MED ORDER — POTASSIUM CHLORIDE CRYS ER 10 MEQ PO TBCR: EXTENDED_RELEASE_TABLET | ORAL | 2 refills | Status: AC

## 2024-08-26 ENCOUNTER — Ambulatory Visit: Admitting: Dermatology

## 2024-10-28 ENCOUNTER — Ambulatory Visit: Admitting: Family Medicine
